# Patient Record
Sex: Female | Born: 1955 | Race: White | Hispanic: No | Marital: Married | Smoking: Former smoker
Health system: Southern US, Academic
[De-identification: ages and names within clinical notes are randomized; demographics above are authoritative.]

## PROBLEM LIST (undated history)

## (undated) DIAGNOSIS — F411 Generalized anxiety disorder: Secondary | ICD-10-CM

## (undated) DIAGNOSIS — I499 Cardiac arrhythmia, unspecified: Secondary | ICD-10-CM

## (undated) DIAGNOSIS — K219 Gastro-esophageal reflux disease without esophagitis: Secondary | ICD-10-CM

## (undated) HISTORY — PX: HX ROTATOR CUFF REPAIR: SHX139

## (undated) HISTORY — DX: Gastro-esophageal reflux disease without esophagitis: K21.9

## (undated) HISTORY — PX: HX CARPAL TUNNEL RELEASE: SHX101

---

## 1992-08-19 ENCOUNTER — Other Ambulatory Visit (HOSPITAL_COMMUNITY): Payer: Self-pay

## 2021-09-23 ENCOUNTER — Other Ambulatory Visit (HOSPITAL_COMMUNITY): Payer: Self-pay

## 2021-09-23 DIAGNOSIS — R102 Pelvic and perineal pain: Secondary | ICD-10-CM

## 2021-09-23 DIAGNOSIS — R109 Unspecified abdominal pain: Secondary | ICD-10-CM

## 2021-09-25 ENCOUNTER — Encounter (HOSPITAL_BASED_OUTPATIENT_CLINIC_OR_DEPARTMENT_OTHER): Payer: Self-pay

## 2021-09-25 ENCOUNTER — Emergency Department (EMERGENCY_DEPARTMENT_HOSPITAL): Payer: Medicare PPO

## 2021-09-25 ENCOUNTER — Emergency Department
Admission: EM | Admit: 2021-09-25 | Discharge: 2021-09-25 | Disposition: A | Discharge status: Home / Self Care | Payer: Medicare PPO | Attending: Family | Admitting: Family

## 2021-09-25 ENCOUNTER — Other Ambulatory Visit: Payer: Self-pay

## 2021-09-25 DIAGNOSIS — R300 Dysuria: Secondary | ICD-10-CM

## 2021-09-25 DIAGNOSIS — R109 Unspecified abdominal pain: Secondary | ICD-10-CM | POA: Insufficient documentation

## 2021-09-25 DIAGNOSIS — M4316 Spondylolisthesis, lumbar region: Secondary | ICD-10-CM | POA: Insufficient documentation

## 2021-09-25 DIAGNOSIS — M419 Scoliosis, unspecified: Secondary | ICD-10-CM | POA: Insufficient documentation

## 2021-09-25 DIAGNOSIS — M48061 Spinal stenosis, lumbar region without neurogenic claudication: Secondary | ICD-10-CM | POA: Insufficient documentation

## 2021-09-25 DIAGNOSIS — K7689 Other specified diseases of liver: Secondary | ICD-10-CM | POA: Insufficient documentation

## 2021-09-25 DIAGNOSIS — Z87440 Personal history of urinary (tract) infections: Secondary | ICD-10-CM | POA: Insufficient documentation

## 2021-09-25 DIAGNOSIS — N200 Calculus of kidney: Secondary | ICD-10-CM | POA: Insufficient documentation

## 2021-09-25 DIAGNOSIS — K389 Disease of appendix, unspecified: Secondary | ICD-10-CM | POA: Insufficient documentation

## 2021-09-25 HISTORY — DX: Generalized anxiety disorder: F41.1

## 2021-09-25 HISTORY — DX: Cardiac arrhythmia, unspecified: I49.9

## 2021-09-25 LAB — URINALYSIS, MACRO/MICRO
BILIRUBIN: NEGATIVE mg/dL
BLOOD: NEGATIVE mg/dL
GLUCOSE: NEGATIVE mg/dL
KETONES: NEGATIVE mg/dL
LEUKOCYTES: NEGATIVE WBCs/uL
NITRITE: NEGATIVE
PH: 6 (ref 4.6–8.0)
PROTEIN: NEGATIVE mg/dL
SPECIFIC GRAVITY: <=1.005 (ref 1.003–1.035)
UROBILINOGEN: 0.2 mg/dL (ref 0.2–1.0)

## 2021-09-25 LAB — COMPREHENSIVE METABOLIC PANEL, NON-FASTING
ALBUMIN/GLOBULIN RATIO: 0.9 (ref 0.8–1.4)
ALBUMIN: 3.4 g/dL (ref 3.4–5.0)
ALKALINE PHOSPHATASE: 73 U/L (ref 46–116)
ALT (SGPT): 22 U/L (ref <=78)
ANION GAP: 7 mmol/L — ABNORMAL LOW (ref 10–20)
AST (SGOT): 24 U/L (ref 15–37)
BILIRUBIN TOTAL: 0.3 mg/dL (ref 0.2–1.0)
BUN/CREA RATIO: 8
BUN: 6 mg/dL — ABNORMAL LOW (ref 7–18)
CALCIUM, CORRECTED: 9.4 mg/dL
CALCIUM: 8.8 mg/dL (ref 8.5–10.1)
CHLORIDE: 103 mmol/L (ref 98–107)
CO2 TOTAL: 27 mmol/L (ref 21–32)
CREATININE: 0.74 mg/dL (ref 0.55–1.02)
ESTIMATED GFR: 90 mL/min/{1.73_m2} (ref >59)
GLOBULIN: 3.6
GLUCOSE: 96 mg/dL (ref 74–106)
OSMOLALITY, CALCULATED: 271 mOsm/kg (ref 270–290)
POTASSIUM: 4 mmol/L (ref 3.5–5.1)
PROTEIN TOTAL: 7 g/dL (ref 6.4–8.2)
SODIUM: 137 mmol/L (ref 136–145)

## 2021-09-25 LAB — CBC WITH DIFF
BASOPHIL #: 0.02 10*3/uL (ref 0.00–2.50)
BASOPHIL %: 0 % (ref 0–3)
EOSINOPHIL #: 0.03 10*3/uL (ref 0.00–2.40)
EOSINOPHIL %: 1 % (ref 0–7)
HCT: 36.2 % — ABNORMAL LOW (ref 37.0–47.0)
HGB: 12.9 g/dL (ref 12.5–16.0)
LYMPHOCYTE #: 2.48 10*3/uL (ref 2.10–11.00)
LYMPHOCYTE %: 40 % (ref 25–45)
MCH: 33.5 pg — ABNORMAL HIGH (ref 27.0–32.0)
MCHC: 35.7 g/dL (ref 32.0–36.0)
MCV: 93.9 fL (ref 78.0–99.0)
MONOCYTE #: 0.34 10*3/uL (ref 0.00–4.10)
MONOCYTE %: 5 % (ref 0–12)
MPV: 7.8 fL (ref 7.4–10.4)
NEUTROPHIL #: 3.33 10*3/uL — ABNORMAL LOW (ref 4.10–29.00)
NEUTROPHIL %: 54 % (ref 40–76)
PLATELETS: 296 10*3/uL (ref 140–440)
RBC: 3.86 10*6/uL — ABNORMAL LOW (ref 4.20–5.40)
RDW: 16.4 % — ABNORMAL HIGH (ref 11.6–14.8)
WBC: 6.2 10*3/uL (ref 4.0–10.5)

## 2021-09-25 MED ORDER — CIPROFLOXACIN 500 MG TABLET
500.0000 mg | ORAL_TABLET | Freq: Two times a day (BID) | ORAL | 0 refills | Status: AC
Start: 2021-09-25 — End: 2021-09-28

## 2021-09-25 MED ORDER — NAPROXEN 500 MG TABLET
500.0000 mg | ORAL_TABLET | Freq: Two times a day (BID) | ORAL | 0 refills | Status: DC
Start: 2021-09-25 — End: 2021-10-19

## 2021-09-25 MED ORDER — KETOROLAC 30 MG/ML (1 ML) INJECTION SOLUTION
INTRAMUSCULAR | Status: AC
Start: 2021-09-25 — End: 2021-09-25
  Filled 2021-09-25: qty 1

## 2021-09-25 MED ORDER — KETOROLAC 30 MG/ML (1 ML) INJECTION SOLUTION
30.0000 mg | INTRAMUSCULAR | Status: AC
Start: 2021-09-25 — End: 2021-09-25
  Administered 2021-09-25: 30 mg via INTRAMUSCULAR

## 2021-09-25 NOTE — ED Provider Notes (Signed)
[facility], [facility] Emergency Department  ED Primary Provider Note  History of Present Illness   Chief Complaint   Patient presents with   . Back Pain     Arrival: The patient arrived by Car    Peggy Pugh is a 66 y.o. female who had concerns including Back Pain.  Flank pain left worse than rt. Hx of recurrent UTI.     Review of Systems   Constitutional: No fever, chills or weakness   Skin: No rash or diaphoresis  HENT: No headaches, or congestion  Eyes: No vision changes or photophobia   Cardio: No chest pain, palpitations or leg swelling   Respiratory: No cough, wheezing or SOB  GI:  No nausea, vomiting , interm constipation, stool changes  GU:  + dysuria,  No hematuria, or increased frequency, flank pain.   MSK: No muscle aches, joint or back pain  Neuro: No seizures, LOC, numbness, tingling, or focal weakness  Psychiatric: No depression, SI or substance abuse  All other systems reviewed and are negative.    Historical Data   History Reviewed This Encounter:  All noted and reviewed.   Physical Exam   ED Triage Vitals [09/25/21 1458]   BP (Non-Invasive) 108/69   Heart Rate 77   Respiratory Rate 18   Temperature 36.8 C (98.3 F)   SpO2 99 %   Weight 60.8 kg (134 lb)   Height 1.524 m (5')       Constitutional:  66 y.o. female who appears in no distress. Normal color, no cyanosis.   HENT:   Head: Normocephalic and atraumatic.   Mouth/Throat: Oropharynx is clear and moist.   Eyes: EOMI, PERRL   Neck: Trachea midline. Neck supple.  Cardiovascular: RRR, No murmurs, rubs or gallops. Intact distal pulses.  Pulmonary/Chest: BS equal bilaterally. No respiratory distress. No wheezes, rales or chest tenderness.   Abdominal: Bowel sounds present and normal. Abdomen soft, no tenderness, no rebound and no guarding.  Back: No midline spinal tenderness, no paraspinal tenderness, + CVA tenderness. Mid left.          Musculoskeletal: No edema, tenderness or deformity.  Skin: warm and dry. No  rash, erythema, pallor or cyanosis  Psychiatric: normal mood and affect. Behavior is normal.   Neurological: Patient keenly alert and responsive, easily able to raise eyebrows, facial muscles/expressions symmetric, speaking in fluent sentences, moving all extremities equally and fully, normal gait  Patient Data     Labs Ordered/Reviewed   COMPREHENSIVE METABOLIC PANEL, NON-FASTING - Abnormal; Notable for the following components:       Result Value    ANION GAP 7 (*)     BUN 6 (*)     All other components within normal limits    Narrative:     Estimated Glomerular Filtration Rate (eGFR) is calculated using the CKD-EPI (2021) equation, intended for patients 72 years of age and older. If gender is not documented or "unknown", there will be no eGFR calculation.   CBC WITH DIFF - Abnormal; Notable for the following components:    RBC 3.86 (*)     HCT 36.2 (*)     MCH 33.5 (*)     RDW 16.4 (*)     NEUTROPHIL # 3.33 (*)     All other components within normal limits   URINALYSIS, MACRO/MICRO - Normal   CBC/DIFF    Narrative:     The following orders were created for panel order CBC/DIFF.  Procedure  Abnormality         Status                     ---------                               -----------         ------                     CBC WITH BJYN[[id number]]                Abnormal            Final result                 Please view results for these tests on the individual orders.   URINALYSIS WITH REFLEX MICROSCOPIC AND CULTURE IF POSITIVE    Narrative:     The following orders were created for panel order URINALYSIS WITH REFLEX MICROSCOPIC AND CULTURE IF POSITIVE.  Procedure                               Abnormality         Status                     ---------                               -----------         ------                     URINALYSIS, MACRO/MICRO[[id number]]      Normal              Final result                 Please view results for these tests on the individual orders.     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 Neither moiety appears enlarged.      Tiny nonobstructing calculus cortical medullary junction the inferior left kidney.      No ureteral calculi or secondary evidence of obstruction is seen on either side. There are some probable phleboliths in the pelvis, some of which are near the ureters but probably not within. There are several nondistended colon segments, probably physiologic contractions.      A possible size prominent appendix is seen, to about 9 mm diameter. No adjacent inflammatory changes are seen. Are there any findings to suggest appendicitis?      This study shows moderately prominent submucosal fat in the cecum and proximal ascending colon. No adjacent inflammatory changes are seen.      Mild levoscoliosis. Minimal anterolisthesis of L3 on L4 and L4 on L5 with mild disc space narrowing.       Tiny cyst in the liver adjacent fissure ligamentum teres 2 small to accurately measure. Density is around 28 year.               One or more dose reduction techniques were used (e.g., Automated exposure control, adjustment of the mA and/or kV according to patient size, use of iterative reconstruction technique).  Radiologist location ID: [place] , UTI pyelonephritis.     Pt educated about all ct findings. Including enlarged appendix, she is non tender i reexamined RLQ and umbilical area. Most of pain is left flank.     Medications Administered in the ED   ketorolac (TORADOL) 30 mg/mL injection (30 mg IntraMUSCULAR Given 09/25/21 1633)     Clinical Impression   Flank pain (Primary)   Dysuria   Disorder of appendix   Nephrolithiasis   Dysuria       Disposition: Discharged

## 2021-09-25 NOTE — ED Nurses Note (Signed)
Lying in bed  visitor at bedside awaiting results of CT  current pain 4/10 after medication

## 2021-09-25 NOTE — ED Triage Notes (Signed)
Lower back pain  to groin area x 1 week. Recent UTI. Finished antibiotics

## 2021-09-26 ENCOUNTER — Other Ambulatory Visit: Payer: Self-pay

## 2021-09-26 DIAGNOSIS — M545 Low back pain, unspecified: Secondary | ICD-10-CM

## 2021-09-26 DIAGNOSIS — R109 Unspecified abdominal pain: Secondary | ICD-10-CM

## 2021-09-26 DIAGNOSIS — N200 Calculus of kidney: Secondary | ICD-10-CM

## 2021-09-26 NOTE — ED Triage Notes (Signed)
Was seen at [facility] yesterday for lower back and pelvic pain.  Found a small kidney stone in her left kidney and an enlarged appendix.  Today she has been nauseated. Has taken Zofran today.

## 2021-09-27 ENCOUNTER — Emergency Department
Admission: EM | Admit: 2021-09-27 | Discharge: 2021-09-27 | Disposition: A | Discharge status: Home / Self Care | Payer: Medicare PPO | Attending: PHYSICIAN ASSISTANT | Admitting: PHYSICIAN ASSISTANT

## 2021-09-27 ENCOUNTER — Encounter (HOSPITAL_COMMUNITY): Payer: Self-pay

## 2021-09-27 DIAGNOSIS — R109 Unspecified abdominal pain: Secondary | ICD-10-CM | POA: Insufficient documentation

## 2021-09-27 DIAGNOSIS — R110 Nausea: Secondary | ICD-10-CM | POA: Insufficient documentation

## 2021-09-27 DIAGNOSIS — M545 Low back pain, unspecified: Secondary | ICD-10-CM | POA: Insufficient documentation

## 2021-09-27 LAB — COMPREHENSIVE METABOLIC PANEL, NON-FASTING
ALBUMIN/GLOBULIN RATIO: 1.3 (ref 0.8–1.4)
ALBUMIN: 3.9 g/dL (ref 3.5–5.7)
ALKALINE PHOSPHATASE: 63 U/L (ref 34–104)
ALT (SGPT): 35 U/L (ref 7–52)
ANION GAP: 7 mmol/L — ABNORMAL LOW (ref 10–20)
AST (SGOT): 54 U/L — ABNORMAL HIGH (ref 13–39)
BILIRUBIN TOTAL: 0.4 mg/dL (ref 0.3–1.2)
BUN/CREA RATIO: 11 (ref 6–22)
BUN: 8 mg/dL (ref 7–25)
CALCIUM, CORRECTED: 9.2 mg/dL (ref 8.9–10.8)
CALCIUM: 9.1 mg/dL (ref 8.6–10.3)
CHLORIDE: 104 mmol/L (ref 98–107)
CO2 TOTAL: 25 mmol/L (ref 21–31)
CREATININE: 0.7 mg/dL (ref 0.60–1.30)
ESTIMATED GFR: 96 mL/min/{1.73_m2} (ref >59)
GLOBULIN: 3.1 (ref 2.9–5.4)
GLUCOSE: 83 mg/dL (ref 74–109)
OSMOLALITY, CALCULATED: 269 mOsm/kg — ABNORMAL LOW (ref 270–290)
POTASSIUM: 4 mmol/L (ref 3.5–5.1)
PROTEIN TOTAL: 7 g/dL (ref 6.4–8.9)
SODIUM: 136 mmol/L (ref 136–145)

## 2021-09-27 LAB — CBC WITH DIFF
BASOPHIL #: 0.1 10*3/uL (ref 0.00–2.50)
BASOPHIL %: 1 % (ref 0–3)
EOSINOPHIL #: 0 10*3/uL (ref 0.00–2.40)
EOSINOPHIL %: 0 % (ref 0–7)
HCT: 38.8 % (ref 37.0–47.0)
HGB: 13.4 g/dL (ref 12.5–16.0)
LYMPHOCYTE #: 3 10*3/uL (ref 2.10–11.00)
LYMPHOCYTE %: 46 % — ABNORMAL HIGH (ref 25–45)
MCH: 32.2 pg — ABNORMAL HIGH (ref 27.0–32.0)
MCHC: 34.5 g/dL (ref 32.0–36.0)
MCV: 93.4 fL (ref 78.0–99.0)
MONOCYTE #: 0.4 10*3/uL (ref 0.00–4.10)
MONOCYTE %: 6 % (ref 0–12)
MPV: 8.3 fL (ref 7.4–10.4)
NEUTROPHIL #: 3 10*3/uL — ABNORMAL LOW (ref 4.10–29.00)
NEUTROPHIL %: 46 % (ref 40–76)
PLATELETS: 293 10*3/uL (ref 140–440)
RBC: 4.15 10*6/uL — ABNORMAL LOW (ref 4.20–5.40)
RDW: 14.4 % (ref 11.6–14.8)
WBC: 6.5 10*3/uL (ref 4.0–10.5)
WBCS UNCORRECTED: 6.5 10*3/uL

## 2021-09-27 LAB — C-REACTIVE PROTEIN(CRP),INFLAMMATION: C-REACTIVE PROTEIN (CRP): 0.3 mg/dL (ref 0.1–0.5)

## 2021-09-27 LAB — LACTIC ACID LEVEL W/ REFLEX FOR LEVEL >2.0: LACTIC ACID: 0.8 mmol/L (ref 0.5–2.2)

## 2021-09-27 MED ORDER — METHOCARBAMOL 500 MG TABLET
500.0000 mg | ORAL_TABLET | Freq: Four times a day (QID) | ORAL | 0 refills | Status: AC
Start: 2021-09-27 — End: 2021-10-02

## 2021-09-27 MED ORDER — ONDANSETRON 4 MG DISINTEGRATING TABLET
4.0000 mg | ORAL_TABLET | Freq: Three times a day (TID) | ORAL | 0 refills | Status: DC | PRN
Start: 2021-09-27 — End: 2022-03-03

## 2021-09-27 NOTE — Discharge Instructions (Signed)
Drink plenty of fluids. Continue any at home medications as previously prescribed. Take medications that are prescribed from today's visit as prescribed. Discuss any questions you may have concerning your medications with your pharmacist. Follow up with your regular PCP in the next 2-3 days. Return to the ED if symptoms worsen, change, or do not improve.

## 2021-09-27 NOTE — ED Provider Notes (Signed)
[facility]  ED Primary Provider Note  History of Present Illness   Chief Complaint   Patient presents with   . Abnormal Lab Result     Peggy Pugh is a 66 y.o. female who had concerns including Abnormal Lab Result.  Arrival: The patient arrived by Car    66 year old female presents ambulatory to the ED complaining of abdominal pain and nausea.  Patient reports that she was at [facility] yesterday for similar complaint.  She had a full workup to include a CT scan there yesterday.  CT scan did show a borderline enlarged appendix at 9 mm no adjacent inflammatory changes.  She is back in our ER today because she started having some increased nausea after taking naproxen.  She reports pain in the lower back that radiates around into the groin.  Denies any fever or chills.  Denies any actual vomiting.  Denies any current urinary symptoms.        Review of Systems   Pertinent positive and negative ROS as per HPI.  Historical Data   History Reviewed This Encounter: Medical History  Surgical History  Family History  Social History      Physical Exam   ED Triage Vitals [09/26/21 2305]   BP (Non-Invasive) 112/68   Heart Rate 84   Respiratory Rate 18   Temperature 36.3 C (97.4 F)   SpO2 100 %   Weight 60.8 kg (134 lb)   Height 1.524 m (5')     Physical Exam  Vitals reviewed.   Constitutional:       General: She is not in acute distress.     Appearance: Normal appearance. She is well-developed.   HENT:      Head: Normocephalic and atraumatic.   Eyes:      Conjunctiva/sclera: Conjunctivae normal.   Cardiovascular:      Rate and Rhythm: Normal rate and regular rhythm.      Heart sounds: No murmur heard.  Pulmonary:      Effort: Pulmonary effort is normal. No respiratory distress.      Breath sounds: Normal breath sounds.   Abdominal:      General: Bowel sounds are normal.      Palpations: Abdomen is soft.      Tenderness: There is no abdominal tenderness. There is no guarding or  rebound.   Musculoskeletal:         General: No swelling.      Cervical back: Neck supple.   Skin:     General: Skin is warm and dry.      Capillary Refill: Capillary refill takes less than 2 seconds.   Neurological:      Mental Status: She is alert.   Psychiatric:         Mood and Affect: Mood normal.       Patient Data     Labs Ordered/Reviewed   COMPREHENSIVE METABOLIC PANEL, NON-FASTING - Abnormal; Notable for the following components:       Result Value    ANION GAP 7 (*)     AST (SGOT) 54 (*)     OSMOLALITY, CALCULATED 269 (*)     All other components within normal limits    Narrative:     Estimated Glomerular Filtration Rate (eGFR) is calculated using the CKD-EPI (2021) equation, intended for patients 26 years of age and older. If gender is not documented or "unknown", there will be no eGFR calculation.   CBC  WITH DIFF - Abnormal; Notable for the following components:    RBC 4.15 (*)     MCH 32.2 (*)     LYMPHOCYTE % 46 (*)     NEUTROPHIL # 3.00 (*)     All other components within normal limits   C-REACTIVE PROTEIN(CRP),INFLAMMATION - Normal   LACTIC ACID LEVEL W/ REFLEX FOR LEVEL >2.0 - Normal   CBC/DIFF    Narrative:     The following orders were created for panel order CBC/DIFF.  Procedure                               Abnormality         Status                     ---------                               -----------         ------                     CBC WITH DIFF[[id number]]                Abnormal            Final result                 Please view results for these tests on the individual orders.     No orders to display     Medical Decision Making        Medical Decision Making  Patient did well while in the ED. No new complaints were voiced. Patient had repeat lab work ordered today since she just had a CT yesterday. Lab work today is within normal limits notably her WBC. She is not tender in the RLQ on exam. She is not having pain after taking naproxen. Her main complaint today was nausea which has  also improved. Given her labs, CT from yesterday and her examination today I do not feel that this is an acute appendicitis. Patient is reporting radiating from her back which could be more musculoskeletal in origin. I will write her robaxin PRN for this.     Amount and/or Complexity of Data Reviewed  Labs: ordered.                  Clinical Impression   Low back pain (Primary)   Abdominal pain, unspecified abdominal location       Disposition: Discharged

## 2021-09-27 NOTE — ED Nurses Note (Signed)
Patient discharged home with family.  AVS reviewed with patient/care giver.  A written copy of the AVS and discharge instructions was given to the patient/care giver.  Questions sufficiently answered as needed.  Patient/care giver encouraged to follow up with PCP as indicated.  In the event of an emergency, patient/care giver instructed to call 911 or go to the nearest emergency room.

## 2021-10-16 ENCOUNTER — Encounter (HOSPITAL_PSYCHIATRIC): Payer: Self-pay | Admitting: Family

## 2021-10-19 ENCOUNTER — Other Ambulatory Visit: Payer: Self-pay

## 2021-10-19 ENCOUNTER — Encounter (HOSPITAL_PSYCHIATRIC): Payer: Self-pay | Admitting: Family

## 2021-10-19 ENCOUNTER — Ambulatory Visit: Payer: Medicare PPO | Attending: Family | Admitting: Family

## 2021-10-19 VITALS — BP 106/68 | HR 99 | Resp 16 | Ht 60.0 in | Wt 134.0 lb

## 2021-10-19 DIAGNOSIS — F410 Panic disorder [episodic paroxysmal anxiety] without agoraphobia: Secondary | ICD-10-CM

## 2021-10-19 DIAGNOSIS — Z79899 Other long term (current) drug therapy: Secondary | ICD-10-CM

## 2021-10-19 DIAGNOSIS — F411 Generalized anxiety disorder: Secondary | ICD-10-CM

## 2021-10-19 HISTORY — DX: Panic disorder (episodic paroxysmal anxiety): F41.0

## 2021-10-19 MED ORDER — ALPRAZOLAM 1 MG TABLET
1.0000 mg | ORAL_TABLET | Freq: Three times a day (TID) | ORAL | 1 refills | Status: DC | PRN
Start: 2021-10-19 — End: 2021-12-02

## 2021-10-19 NOTE — Progress Notes (Signed)
Reason for office visit  Evaluate and treat for mood and depression.   HPI:   In February started on Linzess for chronic constipation then started having panic attacks. Prior to that was taking citrucel, had increased it, started having panic attacks again.   Tried trazodone for sleep, initially slept with meds. Shortly after starting med, had problems with pain and changed from Trazodone back to amitriptyline. Now taking 50 mg and is sleeping, "i have been on it for 29 years."   Had UTI 08/31/2021 and took 3 antibiotics.   I have tried numerous antidepressants.   Has tried Zoloft, luvox, PAXIL, Pamelor,   Anxiety worse with UTI. Now I stay so shaky like meds not working as they should. Has been on this combination for 29 years.   Loss of two brother 2020 in 8 months time.   Has good support system: [place], family.   PHQ9:5  Ph[facility]: [facility].   Past psych history:  Was at [facility], coming home felt hot, could not get out of the car was terrible, eventually diagnosed with panic attacks. Placed on ativan did not help as well. History of anxiety, panic attacks. Denies being hospitalized.     Past medical history: Chronic constipation. Denies concussion.   Past Medical History:   Diagnosis Date   . Acid reflux    . Generalized anxiety disorder    . Irregular heart rate           Family Psychiatric History:   Mother had anxiety. Tension headaches. Denies family history of SUD. Denies family history of schizophrenia, bipolar, or suicide.     Family Medical History:  Family Medical History:     Problem Relation (Age of Onset)    Anxiety Mother    Diabetes Brother    Hypertension (High Blood Pressure) Mother         Brother NASH.     Social History: Peggy Pugh was born in [place]. Lives in this area or [place]. 10th grade, GED, cosmatology license. Had son age 4 and he has spina bifida. Has been his primary caregiver and needs to continue to take care of him. "I am all he has." Denies history ETOH use,  denies smoking but has in the past, Denies THC use or other drug use.   Social History     Socioeconomic History   . Marital status: Married   Tobacco Use   . Smoking status: Former     Types: Cigarettes   . Smokeless tobacco: Never   Vaping Use   . Vaping Use: Never used   Substance and Sexual Activity   . Alcohol use: Never   . Drug use: Never        MSE:  The patient is alert and oriented x4, casually dressed, good eye contact, well groomed, appearing stated age.  Speech is normal rate and tone.  Patient is talkative and personable. There is no flight of ideas, loosening of associations, or tangential speech.  Not manic.  Mood is euthymic with no complaints.  Affect congruent.  Patient does not appear to be in any acute physical distress.  No suicidal or homicidal ideation.  No auditory or visual hallucinations, no delusions, no paranoia.  No signs of psychosis.  No plans to harm self or others.  Patient is not aggressive or threatening.  No psychomotor agitation.  No psychomotor retardation.  No abnormal involuntary movements. Thoughts are linear, logical, and goal directed.  Intellectual functioning is good.  Memory is intact to recent, remote, and past events.  Patient can recall 3 of 3 objects at 0 and 5 minutes, and what was eaten for last meal.  Patient is able to provide details of current situation.  Patient can name the president, vice president, and governor.  Language is good.  Vocabulary is unimpaired, no word finding difficulty or word misuse.  Intelligence is good, patient can interpret a proverb, and reports apple and orange similarity.  Calculation is unimpaired.  Concentration is good, able to recite days of week forward and backward.  Insight is good; patient is aware of their illness, how it affects their functioning, and what needs to happen for future improvement.  Judgment is good; patient is compliant with treatment and can relate appropriately to what they would do if smelling smoke in her  home.   Diagnosis:  Axis 1: GAD with panic attacks, exacerbation   Axis 2:  Allergic to Bactrim, atorvastatin, clindamycin, methocarbomol.   Axis 3: Irregular heart beat, constipation.   Axis 4: Severe psychosocial stressors  Axis 5: Current GAF: 75  , best in past year: unknown.       Plan: Moderate level of medical decision making, discussion of multiple diagnosis and symptoms, review of PHQ-9, MDQ7 review of symptoms, patient education, discussion of prescribed medications and potential side effects, discussion of psychosocial stressors, and review of prescription monitoring program information.   Discussed xanax and if stops meds abruptly could cause seizure. Patient verbalizes understanding.   Consider Inderal 10 mg Twice a day, will communicate with PCP.   [name], PMHNP-BC  10/19/2021, 11:34

## 2021-10-19 NOTE — Addendum Note (Signed)
Addended by: Rickard Rhymes B on: 10/19/2021 12:11 PM     Modules accepted: Orders

## 2021-10-20 NOTE — Addendum Note (Signed)
Addended by: Patton Salles on: 10/20/2021 01:56 PM     Modules accepted: Orders

## 2021-10-24 LAB — DRUG MONITORING, PANEL 5, W/CONFIRM, D/L ISOMERS,URINE
Alphahydroxyalprazolam: 330 ng/mL — ABNORMAL HIGH (ref <25)
Alphahydroxymidazolam: NEGATIVE ng/mL (ref <50)
Alphahydroxytriazolam: NEGATIVE ng/mL (ref <50)
Aminoclonazepam: NEGATIVE ng/mL (ref <25)
Amphetamines: NEGATIVE ng/mL (ref <500)
Barbiturates: NEGATIVE ng/mL (ref <300)
Benzodiazepines: POSITIVE ng/mL — AB (ref <100)
Cocaine Metabolite: NEGATIVE ng/mL (ref <100)
Creatinine: 81.7 mg/dL (ref >=20.0)
Hydroxyethylflurazepam: NEGATIVE ng/mL (ref <50)
Lorazepam: NEGATIVE ng/mL (ref <50)
Marijuana Metabolite: NEGATIVE ng/mL (ref <20)
Methadone Metabolite: NEGATIVE ng/mL (ref <100)
Nordiazepam: NEGATIVE ng/mL (ref <50)
Opiates: NEGATIVE ng/mL (ref <100)
Oxazepam: NEGATIVE ng/mL (ref <50)
Oxidant: NEGATIVE ug/mL (ref <200)
Oxycodone: NEGATIVE ng/mL (ref <100)
Temazepam: NEGATIVE ng/mL (ref <50)
pH: 6.1 (ref 4.5–9.0)

## 2021-10-24 LAB — NOTES AND COMMENTS

## 2021-10-27 ENCOUNTER — Encounter (INDEPENDENT_AMBULATORY_CARE_PROVIDER_SITE_OTHER): Payer: Self-pay | Admitting: Surgery

## 2021-10-27 ENCOUNTER — Other Ambulatory Visit: Payer: Self-pay

## 2021-10-27 ENCOUNTER — Ambulatory Visit (INDEPENDENT_AMBULATORY_CARE_PROVIDER_SITE_OTHER): Payer: Medicare PPO | Admitting: Surgery

## 2021-10-27 VITALS — BP 102/76 | HR 87 | Temp 97.5°F | Ht 60.0 in | Wt 133.0 lb

## 2021-10-27 DIAGNOSIS — R933 Abnormal findings on diagnostic imaging of other parts of digestive tract: Secondary | ICD-10-CM

## 2021-10-29 NOTE — H&P (Signed)
GENERAL SURGERY, [facility]  [address]  [place] [address]    History and Physical     Name: Peggy Pugh MRN:  [id number]   Date: 10/27/2021 Age: 66 y.o.            Reason for Visit: Appendix Adult (Enlarged appendix )    History of Present Illness  [name] presents today to be evaluated because of an enlarged appendix seen on CT scan.  The patient was actually being evaluated because of urinary tract infections and back pain radiating to the lower abdomen.  She was found to have kidney stones.  She was also noted to have a possible prominent appendix to about 9 mm in diameter although she has no symptoms whatsoever that would be suspicious for appendicitis.  The patient states that she has an appointment to see a GI specialist in [place] and therefore I would recommend that she continues that evaluation with a colonoscopy and then proceed from there.      Review of the result(s) of each unique test:  Patient underwent diagnostic testing ( CT scan as mentioned above) prior to this dates visit.  I have personally reviewed the results and that serves as a component of the medical decision making for this encounter       Review of prior external note(s) from each unique source:  Patients referral to this office including a recent assessment by the referring provider.  This was reviewed by me for this unique office visit for the indication and intent of the referral as well as any pertinent medical or surgical history relevant to the patients independent evaluation by me today.      Patient History  Past Medical History:   Diagnosis Date   . Acid reflux    . Generalized anxiety disorder    . Irregular heart rate    . Panic attacks 10/19/2021         Past Surgical History:   Procedure Laterality Date   . HX CARPAL TUNNEL RELEASE     . HX ROTATOR CUFF REPAIR           Current Outpatient Medications   Medication Sig   . ALPRAZolam (XANAX) 1 mg Oral Tablet Take 1  Tablet (1 mg total) by mouth Every 8 hours as needed for Insomnia or Anxiety for up to 30 days May take 1 tablet up to 3 times a day.   Marland Kitchen amitriptyline (ELAVIL) 50 mg Oral Tablet Take 1 Tablet (50 mg total) by mouth Every night   . atenoloL (TENORMIN) 50 mg Oral Tablet Take 1 Tablet (50 mg total) by mouth Once a day   . esomeprazole magnesium (NEXIUM) 40 mg Oral Capsule, Delayed Release(E.C.) Take 1 Capsule (40 mg total) by mouth Once a day   . meloxicam (MOBIC) 15 mg Oral Tablet 1 tab(s) orally PRN   . ondansetron (ZOFRAN ODT) 4 mg Oral Tablet, Rapid Dissolve Take 1 Tablet (4 mg total) by mouth Every 8 hours as needed for Nausea/Vomiting     Allergies   Allergen Reactions   . 5-Hydroxytryptophan      Other reaction(s): Unknown   . Atorvastatin  Other Adverse Reaction (Add comment)     Leg cramping   . Clindamycin  Other Adverse Reaction (Add comment)     uncertain   . Robaxin [Methocarbamol]  Other Adverse Reaction (Add comment)     Head felt weird   . Bactrim [Sulfamethoxazole-Trimethoprim]  Hives/ Urticaria   . Tramadol Mental Status Effect     Family Medical History:     Problem Relation (Age of Onset)    Anxiety Mother    Diabetes Brother    Hypertension (High Blood Pressure) Mother          Social History     Tobacco Use   . Smoking status: Former     Types: Cigarettes   . Smokeless tobacco: Never   Vaping Use   . Vaping Use: Never used   Substance Use Topics   . Alcohol use: Never   . Drug use: Never            Physical Examination:  Vitals:    10/27/21 1555   BP: 102/76   Pulse: 87   Temp: 36.4 C (97.5 F)   Weight: 60.3 kg (133 lb)   Height: 1.524 m (5')   BMI: 26.03        General: appropriate for age. in no acute distress.    Vital signs are present above and have been reviewed by me     HEENT: Atraumatic, Normocephalic. PERRLA. EOMI. Nose clear. Throat clear    Lungs: Nonlabored breathing with symmetric expansion. Clear to auscultation bilaterally    Heart:Regular wth respect to rate and  rythmn.    Abdomen:Soft. Nontender. Nondistended and benign    Extremities: Grossly normal. No major deformities     Neuro:  Grossly normal motor and sensory function    Psychiatric: Alert and oriented to person, place, and time. affect appropriate      Assessment and Plan  Possible enlarged appendix up to 9 mm in diameter with no symptoms     The patient will be seeing a GI specialist in [place].  I recommended that a colonoscopy be performed and then based on those findings then further evaluation and management may be indicated.  At this time because the patient is asymptomatic and this possible distended appendix was seen incidentally on CT scan I would not recommend urgent surgical intervention.  The patient was instructed to notify me of the colonoscopy findings or at least what the GI doctor recommends.      Follow Up:  No follow-ups on file.      ICD-10-CM    1. Abnormal finding on GI tract imaging  R93.3           Florence Yeung B Nolberto Cheuvront, MD ,MBA,FACS    I appreciate the opportunity to be involved in the care of your patients.  If you have any questions or concerns regarding this encounter, please do not hesitate to contact me at your convenience.      This note may have been partially generated using MModal Fluency Direct system, and there may be some incorrect words, spellings, and punctuation that were not noted in checking the note before saving, though effort was made to avoid such errors.

## 2021-12-02 ENCOUNTER — Encounter (HOSPITAL_PSYCHIATRIC): Payer: Self-pay | Admitting: Family

## 2021-12-02 ENCOUNTER — Ambulatory Visit: Payer: Medicare PPO | Attending: Family | Admitting: Family

## 2021-12-02 ENCOUNTER — Other Ambulatory Visit: Payer: Self-pay

## 2021-12-02 VITALS — BP 112/68 | HR 70 | Resp 16 | Ht 60.0 in | Wt 132.0 lb

## 2021-12-02 DIAGNOSIS — F330 Major depressive disorder, recurrent, mild: Secondary | ICD-10-CM | POA: Insufficient documentation

## 2021-12-02 DIAGNOSIS — F411 Generalized anxiety disorder: Secondary | ICD-10-CM | POA: Insufficient documentation

## 2021-12-02 MED ORDER — ALPRAZOLAM 1 MG TABLET
1.0000 mg | ORAL_TABLET | Freq: Three times a day (TID) | ORAL | 2 refills | Status: DC | PRN
Start: 2021-12-02 — End: 2021-12-21

## 2021-12-02 NOTE — Progress Notes (Signed)
[facility]  BEHAVIORAL MEDICINE, [facility] [place]  Operated by [facility]  Progress Note    Name: Peggy Pugh MRN:  [id number]   Date: 12/02/2021 Age: 66 y.o.       Chief Complaint: Panic Disorder and Generalized Anxiety; Medication review.     Subjective:   I am feeling better. Has been to the beach for a while and that helped. Felt paralyzed but now able to do things she needs in order to care for son and household. Ativan some days need 1/2 tab of 3rd dose and sometimes does not need dose. "I feel like I am Verl again." I don't feel jittery inside.    Objective :  BP 112/68 (Site: Right, Patient Position: Sitting, Cuff Size: Adult)   Pulse 70   Resp 16   Ht 1.524 m (5')   Wt 59.9 kg (132 lb)   SpO2 96%   BMI 25.78 kg/m   Pleasant, calm, cordial. Talkative and sociable.   PDMP reviewed and appropriate.   PHQ9: 4    Data reviewed:  Previous HPI:   In February started on Linzess for chronic constipation then started having panic attacks. Prior to that was taking citrucel, had increased it, started having panic attacks again.   Tried trazodone for sleep, initially slept with meds. Shortly after starting med, had problems with pain and changed from Trazodone back to amitriptyline. Now taking 50 mg and is sleeping, "i have been on it for 29 years."   Had UTI 08/31/2021 and took 3 antibiotics.   I have tried numerous antidepressants.   Has tried Zoloft, luvox, PAXIL, Pamelor,   Anxiety worse with UTI. Now I stay so shaky like meds not working as they should. Has been on this combination for 29 years.   Loss of two brother 2020 in 8 months time.   Has good support system: Church, family.   Pharmacy: [facility] or [place]. [phone].   Past psych history:  Was at [facility], coming home felt hot, could not get out of the car was terrible, eventually diagnosed with panic attacks. Placed on ativan did not help as well. History of anxiety, panic  attacks. Denies being hospitalized.     Current Outpatient Medications   Medication Sig   . amitriptyline (ELAVIL) 50 mg Oral Tablet Take 1 Tablet (50 mg total) by mouth Every night   . atenoloL (TENORMIN) 50 mg Oral Tablet Take 1 Tablet (50 mg total) by mouth Once a day   . esomeprazole magnesium (NEXIUM) 40 mg Oral Capsule, Delayed Release(E.C.) Take 1 Capsule (40 mg total) by mouth Once a day   . meloxicam (MOBIC) 15 mg Oral Tablet 1 tab(s) orally PRN   . ondansetron (ZOFRAN ODT) 4 mg Oral Tablet, Rapid Dissolve Take 1 Tablet (4 mg total) by mouth Every 8 hours as needed for Nausea/Vomiting     Assessment/Plan  Problem List Items Addressed This Visit    None  Major depressive disorder, recurrent, mild with GAD.    continue current medications, xanax 1 mg up to TID, amitriptyline 50 mg at bedtime.   follow-up 3 months.     Candie Mile, PMHNP-BC

## 2021-12-21 ENCOUNTER — Other Ambulatory Visit (HOSPITAL_PSYCHIATRIC): Payer: Self-pay | Admitting: Family

## 2021-12-21 MED ORDER — ALPRAZOLAM 1 MG TABLET
1.0000 mg | ORAL_TABLET | Freq: Three times a day (TID) | ORAL | 2 refills | Status: AC | PRN
Start: 2021-12-21 — End: 2022-01-20

## 2021-12-21 NOTE — Telephone Encounter (Signed)
RX approved and encounter closed  Candie Mile, PMHNP-BC  12/21/2021 12:50

## 2021-12-21 NOTE — Telephone Encounter (Signed)
I cancelled patient's prescription that was on hold at [facility] in [place], because patient wants it sent to [facility] in [place]. I have got the prescription ready for refill and added the pharmacy in [place] if you don't mind to refill it there. Thanks!

## 2022-03-03 ENCOUNTER — Encounter (HOSPITAL_PSYCHIATRIC): Payer: Self-pay | Admitting: Family

## 2022-03-03 ENCOUNTER — Other Ambulatory Visit: Payer: Self-pay

## 2022-03-03 ENCOUNTER — Ambulatory Visit: Payer: Medicare PPO | Attending: Family | Admitting: Family

## 2022-03-03 VITALS — BP 96/68 | HR 61 | Resp 18 | Ht 65.0 in | Wt 138.0 lb

## 2022-03-03 DIAGNOSIS — F411 Generalized anxiety disorder: Secondary | ICD-10-CM | POA: Insufficient documentation

## 2022-03-03 DIAGNOSIS — F410 Panic disorder [episodic paroxysmal anxiety] without agoraphobia: Secondary | ICD-10-CM | POA: Insufficient documentation

## 2022-03-03 DIAGNOSIS — F330 Major depressive disorder, recurrent, mild: Secondary | ICD-10-CM | POA: Insufficient documentation

## 2022-03-03 MED ORDER — ALPRAZOLAM 1 MG TABLET
1.0000 mg | ORAL_TABLET | Freq: Three times a day (TID) | ORAL | 2 refills | Status: AC | PRN
Start: 2022-03-03 — End: 2022-04-02
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
## 2022-03-03 NOTE — Patient Instructions (Signed)
Continue xanax 1 mg TID prn anxiety. Elavil 50 mg at bedtime for sleep and mood.     If thoughts of self harm or worsening depression please let us know.

## 2022-04-30 ENCOUNTER — Telehealth (HOSPITAL_PSYCHIATRIC): Payer: Self-pay | Admitting: Family

## 2022-04-30 NOTE — Telephone Encounter (Signed)
Patient called and said 6 months ago that you had changed the dosage of Xanax. Tuesday got real sick on her stomach and had a headache. That night she did not sleep. Wednesday and Thursday night she has not slept. Has been taking 2 of the Xanax a day. Yesterday she took three and it still did not help.      [phone]

## 2022-04-30 NOTE — Telephone Encounter (Signed)
Call to patient. "I have been doing fantastic." Has been at [facility] .Taking her xanax. Has not needed 3 a day. Had been sleeping well and doing well. Reports this past Tuesday evening was nausea with headache, top of head. Took tylenol and Zofran, helped some.  Slept 2 hours and was awake.   Wednesday night: did not sleep at all, took 1/2 xanax 0200.   Took covid test negative  When woke up this morning my head was hurting  Denies being upset etc. This is not a new change for [place].   Relates she was around someone Sunday with Covid.   Speech is a little slurred.

## 2022-05-03 ENCOUNTER — Encounter (INDEPENDENT_AMBULATORY_CARE_PROVIDER_SITE_OTHER): Payer: Self-pay | Admitting: Surgery

## 2022-06-24 ENCOUNTER — Encounter (HOSPITAL_PSYCHIATRIC): Payer: Self-pay | Admitting: Family

## 2022-06-24 ENCOUNTER — Other Ambulatory Visit: Payer: Self-pay

## 2022-06-24 ENCOUNTER — Ambulatory Visit: Payer: Medicare PPO | Attending: Family | Admitting: Family

## 2022-06-24 VITALS — BP 116/75 | HR 92 | Resp 18 | Ht 60.0 in | Wt 138.0 lb

## 2022-06-24 DIAGNOSIS — F411 Generalized anxiety disorder: Secondary | ICD-10-CM | POA: Insufficient documentation

## 2022-06-24 DIAGNOSIS — Z79899 Other long term (current) drug therapy: Secondary | ICD-10-CM | POA: Insufficient documentation

## 2022-06-24 DIAGNOSIS — F410 Panic disorder [episodic paroxysmal anxiety] without agoraphobia: Secondary | ICD-10-CM

## 2022-06-24 MED ORDER — ALPRAZOLAM 1 MG TABLET
1.0000 mg | ORAL_TABLET | Freq: Three times a day (TID) | ORAL | 2 refills | Status: DC | PRN
Start: 2022-06-24 — End: 2023-05-10

## 2022-06-24 MED ORDER — AMITRIPTYLINE 50 MG TABLET
50.0000 mg | ORAL_TABLET | Freq: Every evening | ORAL | 1 refills | Status: DC
Start: 2022-06-24 — End: 2022-12-27

## 2022-06-24 NOTE — Patient Instructions (Signed)
Discussed with Peggy Pugh risk of long term use of xanax. Relates she has taken for a long time. She tried years ago to come off of med and had profound anxiety. She is aware of risk of falling and that she can not stop med abruptly. She knows to keep medication safe.     Continue Xanax 1 mg up to 3 times a day for anxiety.  Continue Elavil 50 mg at bedtime for sleep and mood.   Peggy Pugh is aware to contact us if there are concerns or problems  Follow up end of February early march prior to returning to the beach.

## 2022-06-24 NOTE — Progress Notes (Signed)
[place] [facility]ICINE, [facility] [place]  Operated by [facility]  Progress Note    Name: Peggy Pugh MRN:  [id number]   Date: 06/24/2022 Age: 66 y.o.       Chief Complaint: Panic Disorder and Generalized Anxiety    Subjective:   Peggy Pugh is here for follow up regarding anxiety. Relates this morning she has some sore throat.   Reports sleeping well.   Denies SI/HI/AVH. Feels her mood is good overall.   Taking xanax twice a day every day and 3 a day if needed. Taking Elavil at bedtime.   Plans to return to the [facility] [place] and spend her birthday there. Plans Church for [employer]. 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     Feels last true panic attack was prior to when she came to see me.     Objective :  BP 116/75 (Site: Right, Patient Position: Sitting)   Pulse 92   Resp 18   Ht 1.524 m (5')   Wt 62.6 kg (138 lb)   BMI 26.95 kg/m     Denies chest pain, Denies recent falls, denies flutters in chest, denies dizziness.    Alert and oriented x4.  Casual dress, calm, well groomed.  No SI /HI/AVH, delusions, or paranoia. Thoughts are logical, coherent, goal directed.  Good eye contact. Speech is normal rate and tone.  Mood is ``ok affect congruent.  No psychomotor agitation or psychomotor retardation, no cogwheel rigidity or abnormal movements.  Gait is normal.  Attention is good.  Concentration and memory good.  No cognitive deficits noted.  Judgment fair, insight fair.  Calculation and abstraction are within normal limits.      PDMP: Appropriate, xanax filled 05/31/2022  PHQ9: 0    Data reviewed:    Current Outpatient Medications   Medication Sig    ALPRAZolam (XANAX) 1 mg Oral Tablet Take 1 Tablet (1 mg total) by mouth Every 8 hours as needed for Anxiety    amitriptyline (ELAVIL) 50 mg Oral Tablet Take 1 Tablet (50 mg total) by mouth Every night    atenoloL (TENORMIN) 50 mg Oral Tablet Take 1 Tablet (50 mg total) by mouth Once a day    esomeprazole  magnesium (NEXIUM) 40 mg Oral Capsule, Delayed Release(E.C.) Take 1 Capsule (40 mg total) by mouth Once a day    fluticasone propionate (FLONASE) 50 mcg/actuation Nasal Spray, Suspension Administer 2 Sprays into each nostril Once per day as needed for Other (Nasal congestion)    Lactobac 40-Bifido 3-S.thermop 100 billion cell Oral Capsule Take 1 Capsule by mouth Once a day    magnesium oxide (MAG-OX) 400 mg Oral Tablet Take 1 Tablet (400 mg total) by mouth Every night     Assessment/Plan  Problem List Items Addressed This Visit          Psychiatric    Panic attacks (Chronic)    GAD (generalized anxiety disorder) - Primary     Alert and oriented x4.  Casual dress, calm, well groomed.  No SI /HI/AVH, delusions, or paranoia. Thoughts are logical, coherent, goal directed.  Good eye contact. Speech is normal rate and tone.  Mood is ``ok affect congruent.  No psychomotor agitation or psychomotor retardation, no cogwheel rigidity or abnormal movements.  Gait is normal.  Attention is good.  Concentration and memory good.  No cognitive deficits noted.  Judgment fair, insight fair.  Calculation and abstraction are within normal limits.  Discussed with Peggy Pugh risk of long term use of xanax. Relates she has taken for a long time. She tried years ago to come off of med and had profound anxiety. She is aware of risk of falling and that she can not stop med abruptly. She knows to keep medication safe.     Continue Xanax 1 mg up to 3 times a day for anxiety.  Continue Elavil 50 mg at bedtime for sleep and mood.   [name] is aware to contact Peggy Pugh if there are concerns or problems  Follow up end of February early march prior to returning to the beach.    Peggy Pugh, PMHNP-BC  06/24/2022 12:27

## 2022-07-02 ENCOUNTER — Ambulatory Visit (HOSPITAL_PSYCHIATRIC): Payer: Self-pay | Admitting: Family

## 2022-09-08 ENCOUNTER — Other Ambulatory Visit: Payer: Self-pay

## 2022-09-08 ENCOUNTER — Ambulatory Visit: Payer: Medicare PPO | Attending: Family | Admitting: Family

## 2022-09-08 ENCOUNTER — Encounter (HOSPITAL_PSYCHIATRIC): Payer: Self-pay | Admitting: Family

## 2022-09-08 VITALS — BP 109/73 | HR 72 | Resp 18 | Ht 65.0 in | Wt 143.0 lb

## 2022-09-08 DIAGNOSIS — F410 Panic disorder [episodic paroxysmal anxiety] without agoraphobia: Secondary | ICD-10-CM | POA: Insufficient documentation

## 2022-09-08 DIAGNOSIS — F330 Major depressive disorder, recurrent, mild: Secondary | ICD-10-CM | POA: Insufficient documentation

## 2022-09-08 DIAGNOSIS — F411 Generalized anxiety disorder: Secondary | ICD-10-CM | POA: Insufficient documentation

## 2022-09-08 NOTE — Progress Notes (Signed)
[place], [facility] [place]  Operated by [facility]  Progress Note    Name: Peggy Pugh MRN:  [id number]   Date: 09/08/2022 Age: 67 y.o.       Chief Complaint: Panic Disorder and Generalized Anxiety    Subjective:   Patient here for follow up. Episode Monday, out of town shopping with friend. That afternoon, did not feel depressed, but was not as into shopping as normally would be. Was on couch, watching a movie but fell asleep. Went to bed, then awoke 1230 and could not go back to sleep. Took 1/2 of a xanax but did not go back to sleep.   Yesterday had a small shaky episode, after took 5 pm xanax, decided to take 1 tab at bedtime for sleep.   Other than a few days has been ok.   But sometimes during winter, a swoop of depression but does not stay.   Taking acidophilus   On average took xanax 2 times a day and then 1/2 to 1 at night if needed for sleep.   Denies thoughts of SI/HI/AVH.    Objective :  BP 109/73 (Site: Left, Patient Position: Sitting)   Pulse 72   Resp 18   Ht 1.651 m (5\' 5" )   Wt 64.9 kg (143 lb)   BMI 23.80 kg/m     Denies chest pain, flutters in chest, syncope.     Alert and oriented x4.  Casual dress, calm, well groomed.  No SI /HI/AVH, delusions, or paranoia. Thoughts are logical, coherent, goal directed.  Good eye contact. Speech is normal rate and tone.  Mood is ``ok affect congruent.  No psychomotor agitation or psychomotor retardation, no cogwheel rigidity or abnormal movements.  Gait is normal.  Attention is good.  Concentration and memory good.  No cognitive deficits noted.  Judgment fair, insight fair.  Calculation and abstraction are within normal limits.      PDMP: Xanax filled every 30 days, patient has a "full " bottle and relates she does not need refill currently.  PHQ9: 1  Data reviewed:    Current Outpatient Medications   Medication Sig    ALPRAZolam (XANAX) 1 mg Oral Tablet Take 1 Tablet (1 mg total)  by mouth Every 8 hours as needed for Anxiety    amitriptyline (ELAVIL) 50 mg Oral Tablet Take 1 Tablet (50 mg total) by mouth Every night for 90 days Indications: anxious, depression    atenoloL (TENORMIN) 50 mg Oral Tablet Take 1 Tablet (50 mg total) by mouth Once a day    esomeprazole magnesium (NEXIUM) 40 mg Oral Capsule, Delayed Release(E.C.) Take 1 Capsule (40 mg total) by mouth Once a day    fluticasone propionate (FLONASE) 50 mcg/actuation Nasal Spray, Suspension Administer 2 Sprays into each nostril Once per day as needed for Other (Nasal congestion)    Lactobac 40-Bifido 3-S.thermop 100 billion cell Oral Capsule Take 1 Capsule by mouth Once a day    magnesium oxide (MAG-OX) 400 mg Oral Tablet Take 1 Tablet (400 mg total) by mouth Every night     Assessment/Plan  Problem List Items Addressed This Visit          Psychiatric    Panic attacks (Chronic)    GAD (generalized anxiety disorder)     Other Visit Diagnoses       Mild episode of recurrent major depressive disorder (CMS HCC)    -  Primary  Moderate level of medical decision making, discussion of multiple diagnosis and symptoms, review of PHQ-9, review of symptoms, patient education, discussion of prescribed medications and potential side effects, discussion of psychosocial stressors, and review of prescription monitoring program information.   Patient is currently doing very well. Reassurance provided.  Continue xanax 1 mg up to 3 times a day for anxiety, advised to keep med safe. Do not drink alcohol and take med. Be cautious regarding falls. Do not stop med abruptly due to risk of seizures.   Continue Elavil 50 mg at bedtime as well for mood.   Follow-up 4 months. Let me know when you need a refill.     [name], PMHNP-BC  09/08/2022 13:55

## 2022-09-08 NOTE — Patient Instructions (Signed)
Continue xanax 1 mg up to 3 times a day for anxiety, advised to keep med safe. Do not drink alcohol and take med. Be cautious regarding falls. Do not stop med abruptly due to risk of seizures.   Continue Elavil 50 mg at bedtime as well for mood.   Follow-up 4 months. Let me know when you need a refill.

## 2022-09-09 ENCOUNTER — Encounter (HOSPITAL_PSYCHIATRIC): Payer: Self-pay | Admitting: Family

## 2022-09-09 NOTE — Telephone Encounter (Signed)
Patient called and states she took her elavil and 1/2 her xanax last night. She states she only slept from 11pm-3am.  Will be happy to call patient back with any advice or orders you may have. Thank You

## 2022-09-13 ENCOUNTER — Telehealth (HOSPITAL_PSYCHIATRIC): Payer: Self-pay | Admitting: Family

## 2022-09-13 NOTE — Telephone Encounter (Signed)
Patient called and states that she is still struggling with insomnia. She states that maybe one night out of the week she may sleep all night, but the rest of the week she is only sleeping 3 hours a night. She states that she is taking her xanax 1 mg in the morning, 8 hours later she takes another 1 mg tablet, and the 1/2 tablet at bedtime. She states that she also takes her Amitriptyline. None of this regimen is helping and she is wondering if you have any recommendations. Just let me know your thoughts and I will give her a call back. Thanks!

## 2022-09-13 NOTE — Telephone Encounter (Signed)
Called patient per [name] with her instructions to take a whole Xanax three times a day. Patient verbalized understanding and states that she has never been told she has sleep apnea or been tested for it. She states that she has tried the relaxation techniques without much success. Patient states that she has an appointment with [name] on Thursday, 03.14.2024 and will discuss further then. Patient appreciated the call back.

## 2022-09-16 ENCOUNTER — Other Ambulatory Visit: Payer: Self-pay

## 2022-09-16 ENCOUNTER — Encounter (HOSPITAL_PSYCHIATRIC): Payer: Self-pay | Admitting: Family

## 2022-09-16 ENCOUNTER — Ambulatory Visit: Payer: Medicare PPO | Attending: Family | Admitting: Family

## 2022-09-16 VITALS — BP 115/69 | HR 81 | Resp 18 | Ht 60.0 in | Wt 138.0 lb

## 2022-09-16 DIAGNOSIS — F410 Panic disorder [episodic paroxysmal anxiety] without agoraphobia: Secondary | ICD-10-CM | POA: Insufficient documentation

## 2022-09-16 DIAGNOSIS — G479 Sleep disorder, unspecified: Secondary | ICD-10-CM | POA: Insufficient documentation

## 2022-09-16 DIAGNOSIS — F330 Major depressive disorder, recurrent, mild: Secondary | ICD-10-CM | POA: Insufficient documentation

## 2022-09-16 MED ORDER — AMITRIPTYLINE 10 MG TABLET
10.0000 mg | ORAL_TABLET | Freq: Every evening | ORAL | 1 refills | Status: DC
Start: 2022-09-16 — End: 2022-09-23

## 2022-09-16 NOTE — Progress Notes (Signed)
BEHAVIORAL MEDICINE, [facility] [place]  [place] [id number]  Operated by [facility]  Progress Note    Name: Peggy Pugh MRN:  [id number]   Date: 09/16/2022 DOB:  22-Jun-1956 (67 y.o.)           Chief Complaint: Generalized Anxiety and Panic Disorder    Subjective:   Patient here once again for concerns regarding sleep.   Reports stress regarding possibly selling home, buying home, moving. The home here is her security, sometimes I am ready and sometimes I am not.     Has now been taking xanax 3 a day and sleeping better.   Trial of increase Elavil to 75 mg nightly, tried one night and did not go well.     Objective :  BP 115/69 (Site: Right, Patient Position: Sitting)   Pulse 81   Resp 18   Ht 1.524 m (5')   Wt 62.6 kg (138 lb)   BMI 26.95 kg/m     Denies chest pain, dizziness, palpitations. Denies feeling shaky.     Alert and oriented x4.  Casual dress, calm, well groomed.  No SI /HI/AVH, delusions, or paranoia. Thoughts are logical, coherent, goal directed.  Good eye contact. Speech is normal rate and tone.  Mood is ``ok affect congruent.  No psychomotor agitation or psychomotor retardation, no cogwheel rigidity or abnormal movements.  Gait is normal.  Attention is good.  Concentration and memory good.  No cognitive deficits noted.  Judgment fair, insight fair.  Calculation and abstraction are within normal limits.      PHQ9: 1  PDMP: Xanax filled 09/02/2022    Data reviewed:    Current Outpatient Medications   Medication Sig    ALPRAZolam (XANAX) 1 mg Oral Tablet Take 1 Tablet (1 mg total) by mouth Every 8 hours as needed for Anxiety    amitriptyline (ELAVIL) 50 mg Oral Tablet Take 1 Tablet (50 mg total) by mouth Every night for 90 days Indications: anxious, depression    atenoloL (TENORMIN) 50 mg Oral Tablet Take 1 Tablet (50 mg total) by mouth Once a day    esomeprazole magnesium (NEXIUM) 40 mg Oral Capsule, Delayed Release(E.C.)  Take 1 Capsule (40 mg total) by mouth Once a day    fluticasone propionate (FLONASE) 50 mcg/actuation Nasal Spray, Suspension Administer 2 Sprays into each nostril Once per day as needed for Other (Nasal congestion)    Lactobac 40-Bifido 3-S.thermop 100 billion cell Oral Capsule Take 1 Capsule by mouth Once a day    magnesium oxide (MAG-OX) 400 mg Oral Tablet Take 1 Tablet (400 mg total) by mouth Every night     Assessment/Plan  Problem List Items Addressed This Visit          Psychiatric    Panic attacks (Chronic)     Other Visit Diagnoses       Mild episode of recurrent major depressive disorder (CMS HCC)    -  Primary    Sleep disturbance              Moderate level of medical decision making, discussion of multiple diagnosis and symptoms, review of PHQ-9, review of symptoms, patient education, discussion of prescribed medications and potential side effects, discussion of psychosocial stressors, and review of prescription monitoring program information.   May take xanax 1 mg up to 3 times a day.  Continue Elavil 50 mg at bedtime and add amitriptyline (Elavil) 10 mg to the  Elavil. If that does not help sleep let me know.   If sleep does not improve consider adding seroquel 12.5 mg at bedtime or Remeron 7.5 mg at bedtime.   [name], PMHNP-BC  09/16/2022 11:05

## 2022-09-16 NOTE — Patient Instructions (Addendum)
May take xanax 1 mg up to 3 times a day.  Continue Elavil 50 mg at bedtime and add amitriptyline (Elavil) 10 mg to the Elavil. If that does not help sleep let me know.   If sleep does not improve consider adding seroquel 12.5 mg at bedtime or Remeron 7.5 mg at bedtime.

## 2022-09-23 ENCOUNTER — Ambulatory Visit: Payer: Medicare PPO | Attending: Family | Admitting: Family

## 2022-09-23 ENCOUNTER — Encounter (HOSPITAL_PSYCHIATRIC): Payer: Self-pay | Admitting: Family

## 2022-09-23 ENCOUNTER — Other Ambulatory Visit: Payer: Self-pay

## 2022-09-23 VITALS — BP 116/62 | HR 71 | Resp 18 | Ht 60.0 in | Wt 138.0 lb

## 2022-09-23 DIAGNOSIS — G479 Sleep disorder, unspecified: Secondary | ICD-10-CM | POA: Insufficient documentation

## 2022-09-23 DIAGNOSIS — F330 Major depressive disorder, recurrent, mild: Secondary | ICD-10-CM | POA: Insufficient documentation

## 2022-09-23 DIAGNOSIS — F410 Panic disorder [episodic paroxysmal anxiety] without agoraphobia: Secondary | ICD-10-CM | POA: Insufficient documentation

## 2022-09-23 MED ORDER — AMITRIPTYLINE 10 MG TABLET
10.0000 mg | ORAL_TABLET | Freq: Every evening | ORAL | 1 refills | Status: DC
Start: 2022-09-23 — End: 2022-12-27

## 2022-09-23 NOTE — Patient Instructions (Signed)
Continue current regimen. Once back to beach try cutting back on xanax or the elavil 10 mg.   Otherwise let me know when you need prescriptions.   Follow-up 3 months. As scheduled.

## 2022-09-23 NOTE — Progress Notes (Signed)
BEHAVIORAL MEDICINE, [facility] [place]  [place]  [place] [id number]  Operated by [facility]  Progress Note    Name: Peggy Pugh MRN:  [id number]   Date: 09/23/2022 DOB:  Jun 15, 1956 (67 y.o.)           Chief Complaint: Panic Disorder and Generalized Anxiety    Subjective:   Ngan is here for one week followup due to her anxiety and issues surrounding sleep. Appt for follow up scheduled as she recently had called requesting earlier appointment.   [place]   Last visit instructed to add elavil 10 mg to her 50 mg dose at bedtime for sleep and anxiety as 75 mg was too much for her.   She also has her xanax 1 mg may take up to 3 times a day for anxiety.   Takes elavil 60 mg total at night with 0.5 mg xanax. Does not feel hung over.   Feels daytime anxiety has decreased some with the increase in elavil, today a little shaky.   Plans to return to [place] this week to stay for a little while. Worried about health of son.   NO SI/HI/AVH  Objective :  BP 116/62 (Site: Left, Patient Position: Sitting)   Pulse 71   Resp 18   Ht 1.524 m (5')   Wt 62.6 kg (138 lb)   BMI 26.95 kg/m     Denies dizziness, palpitations, syncope.    Alert and oriented x4.  Casual dress, calm, well groomed.  No SI /HI/AVH, delusions, or paranoia. Thoughts are logical, coherent, goal directed.  Good eye contact. Speech is normal rate and tone.  Mood is ``ok affect congruent.  No psychomotor agitation or psychomotor retardation, no cogwheel rigidity or abnormal movements.  Gait is normal.  Attention is good.  Concentration and memory good.  No cognitive deficits noted.  Judgment fair, insight fair.  Calculation and abstraction are within normal limits.      PDMP: Appropriate  PHQ9: 0  Data reviewed:    Current Outpatient Medications   Medication Sig    ALPRAZolam (XANAX) 1 mg Oral Tablet Take 1 Tablet (1 mg total) by mouth Every 8 hours as needed  for Anxiety    amitriptyline (ELAVIL) 10 mg Oral Tablet Take 1 Tablet (10 mg total) by mouth Every night Take with 50 mg Indications: difficulty sleeping    amitriptyline (ELAVIL) 50 mg Oral Tablet Take 1 Tablet (50 mg total) by mouth Every night for 90 days Indications: anxious, depression    atenoloL (TENORMIN) 50 mg Oral Tablet Take 1 Tablet (50 mg total) by mouth Once a day    esomeprazole magnesium (NEXIUM) 40 mg Oral Capsule, Delayed Release(E.C.) Take 1 Capsule (40 mg total) by mouth Once a day    fluticasone propionate (FLONASE) 50 mcg/actuation Nasal Spray, Suspension Administer 2 Sprays into each nostril Once per day as needed for Other (Nasal congestion)    Lactobac 40-Bifido 3-S.thermop 100 billion cell Oral Capsule Take 1 Capsule by mouth Once a day    magnesium oxide (MAG-OX) 400 mg Oral Tablet Take 1 Tablet (400 mg total) by mouth Every night     Assessment/Plan  Problem List Items Addressed This Visit          Psychiatric    Panic attacks (Chronic)     Other Visit Diagnoses       Sleep disturbance    -  Primary    Mild episode of recurrent major depressive disorder (CMS HCC)              Moderate level of medical decision making, discussion of multiple diagnosis and symptoms, review of PHQ-9, review of symptoms, patient education, discussion of prescribed medications and potential side effects, discussion of psychosocial stressors, and review of prescription monitoring program information.   Continue current regimen. Once back to beach try cutting back on xanax or the elavil 10 mg.   Otherwise let me know when you need prescriptions.   Follow-up 3 months. As scheduled.    [name], PMHNP-BC  09/23/2022 14:26

## 2022-09-29 ENCOUNTER — Telehealth (HOSPITAL_PSYCHIATRIC): Payer: Self-pay | Admitting: Family

## 2022-09-29 NOTE — Telephone Encounter (Signed)
Pt reports she was able to sleep for 5 days but for the last 4 days is unable to sleep. Un- able to sleep since last Thursday. Pt reports taking her 1/2 xanax and 60 mg amitriptyline but still unable to sleep.

## 2022-09-29 NOTE — Telephone Encounter (Signed)
Patient returned call, Relates not sleeping, has not changed diet other than less caffeine.     When I don't sleep worries about being able to sleep.   Relates going 4 nights without sleep, "maybe fall asleep and wake right back up. " She is afraid to try another medication.   Advised before when she took 75 mg of amitriptyline before was too groggy. Advised if the increase in meds does not help will need to consider bipolar disorder and changing medications.   "I took a test, told was panic and not bipolar. " Denies new stressors. She will let us know.

## 2022-09-29 NOTE — Telephone Encounter (Signed)
Call to patient. Voicemail.

## 2022-10-05 ENCOUNTER — Encounter (HOSPITAL_PSYCHIATRIC): Payer: Self-pay | Admitting: Family

## 2022-10-05 NOTE — Telephone Encounter (Signed)
Patient called and left message asking if her Elavil 10 mg was sent in. I called [facility] and verified both doses were there her 50 mg and 10 mg. When i called patient back to tell her she said you had discussed bumping it up to 2 10 mg with the 50 mg at night. I let patient know all I could see is the orders for 60 mg total at night but I would put a message in to you for when you returned to work . Thank You

## 2022-10-22 ENCOUNTER — Encounter (HOSPITAL_PSYCHIATRIC): Payer: Self-pay | Admitting: Family

## 2022-10-22 NOTE — Telephone Encounter (Signed)
Patient called and wants to know if its safe for her to take a whole xanax ( ) at night with her 70 mg amitriptyline. Patient also wanted to let you know [place]'s currently at the beach. Happy to call patient back with advice/orders. Thank You

## 2022-12-01 ENCOUNTER — Other Ambulatory Visit (HOSPITAL_PSYCHIATRIC): Payer: Self-pay | Admitting: Family

## 2022-12-01 NOTE — Telephone Encounter (Signed)
Patient called and said she has been taking 70 mg of Amitriptyline and it has been doing well with half of her Xanax. Patient says when she went to pick up her refill it still says take 1 10 mg with 50 mg of Amitriptyline. I built a prescription for 2 and put the pharmacy she wants it sent too in [place]. If you don't mind to approve or deny. Thank You

## 2022-12-02 MED ORDER — AMITRIPTYLINE 10 MG TABLET
20.0000 mg | ORAL_TABLET | Freq: Every evening | ORAL | 1 refills | Status: DC
Start: 2022-12-02 — End: 2022-12-27

## 2022-12-13 ENCOUNTER — Telehealth (HOSPITAL_PSYCHIATRIC): Payer: Self-pay | Admitting: Family

## 2022-12-13 MED ORDER — ALPRAZOLAM 1 MG TABLET
1.0000 mg | ORAL_TABLET | Freq: Three times a day (TID) | ORAL | 1 refills | Status: DC | PRN
Start: 2022-12-13 — End: 2022-12-27

## 2022-12-13 NOTE — Telephone Encounter (Signed)
Attempt to review PDMP [place], [place], [place]. [place] would not pull. RX sent.

## 2022-12-13 NOTE — Telephone Encounter (Signed)
Patient called requesting a prescription for xanax to be sent to [facility] in [place], I couldn't see where a recent prescription was sent.   Thank you.

## 2022-12-27 ENCOUNTER — Other Ambulatory Visit: Payer: Self-pay

## 2022-12-27 ENCOUNTER — Encounter (HOSPITAL_PSYCHIATRIC): Payer: Self-pay | Admitting: Family

## 2022-12-27 ENCOUNTER — Ambulatory Visit: Payer: Medicare PPO | Attending: Family | Admitting: Family

## 2022-12-27 VITALS — BP 108/72 | Resp 18 | Ht 60.0 in | Wt 141.0 lb

## 2022-12-27 DIAGNOSIS — F330 Major depressive disorder, recurrent, mild: Secondary | ICD-10-CM | POA: Insufficient documentation

## 2022-12-27 DIAGNOSIS — G479 Sleep disorder, unspecified: Secondary | ICD-10-CM | POA: Insufficient documentation

## 2022-12-27 DIAGNOSIS — F410 Panic disorder [episodic paroxysmal anxiety] without agoraphobia: Secondary | ICD-10-CM | POA: Insufficient documentation

## 2022-12-27 DIAGNOSIS — F411 Generalized anxiety disorder: Secondary | ICD-10-CM | POA: Insufficient documentation

## 2022-12-27 MED ORDER — AMITRIPTYLINE 10 MG TABLET
20.0000 mg | ORAL_TABLET | Freq: Every evening | ORAL | 1 refills | Status: DC
Start: 2022-12-27 — End: 2023-02-24

## 2022-12-27 MED ORDER — AMITRIPTYLINE 50 MG TABLET
50.0000 mg | ORAL_TABLET | Freq: Every evening | ORAL | 1 refills | Status: DC
Start: 2022-12-27 — End: 2023-02-24

## 2022-12-27 MED ORDER — ALPRAZOLAM 1 MG TABLET
1.0000 mg | ORAL_TABLET | Freq: Three times a day (TID) | ORAL | 2 refills | Status: AC | PRN
Start: 2022-12-27 — End: 2023-03-27

## 2022-12-27 NOTE — Progress Notes (Signed)
BEHAVIORAL MEDICINE, [facility] [place]  [address]  [place] [address]  Operated by [facility]  Progress Note    Name: Peggy Pugh MRN:  [id number]   Date: 12/27/2022 DOB:  [birth year] (67 y.o.)             Chief Complaint: Panic Disorder and Generalized Anxiety  Subjective   Subjective:   Here for followup. Relates she tried decreasing xanax at night in April but then the anxiety worsened. Now taking 1 tab twice a day and a half at night.   Elavil total of 70 mg at night.   Has decided that a lot of her anxiety is due to worries regarding her adult son who has special needs. He does not want to be in this and prefers to stay at the beach.   Another fear is discussing selling home locally. Looking for a home at the beach has worsened her anxiety.   Expresses desire to decrease xanax.   Denies SI/HI/ AVH.      Objective   Objective :  BP 108/72 (Site: Left, Patient Position: Sitting)   Resp 18   Ht 1.524 m (5')   Wt 64 kg (141 lb)   BMI 27.54 kg/m     Denies chest pain, palpitations, syncope, shortness of breath, new skin rashes.      Alert and oriented x4.  Casual dress, calm, well groomed.  No SI /HI/AVH, delusions, or paranoia. Thoughts are logical, coherent, goal directed.  Good eye contact. Speech is normal rate and tone.  Mood is ``ok affect congruent.  No psychomotor agitation or psychomotor retardation, no cogwheel rigidity or abnormal movements.  Gait is normal.  Attention is good.  Concentration and memory good.  No cognitive deficits noted.  Judgment fair, insight fair.  Calculation and abstraction are within normal limits.     PDMP: [place] not show [place].   PHQ9: 0    Data reviewed:    Current Outpatient Medications   Medication Sig    ALPRAZolam (XANAX) 1 mg Oral Tablet Take 1 Tablet (1 mg total) by mouth Every 8 hours as needed for Anxiety for up to 60 days Indications: anxious    amitriptyline (ELAVIL) 10 mg Oral Tablet Take 1 Tablet  (10 mg total) by mouth Every night for 180 days Take with 50 mg Indications: difficulty sleeping    amitriptyline (ELAVIL) 10 mg Oral Tablet Take 2 Tablets (20 mg total) by mouth Every night for 180 days Take 2 tabs with 50 mg of Amitriptyline nightly Indications: anxious, difficulty sleeping    amitriptyline (ELAVIL) 50 mg Oral Tablet Take 1 Tablet (50 mg total) by mouth Every night for 90 days Indications: anxious, depression    atenoloL (TENORMIN) 50 mg Oral Tablet Take 1 Tablet (50 mg total) by mouth Once a day    esomeprazole magnesium (NEXIUM) 40 mg Oral Capsule, Delayed Release(E.C.) Take 1 Capsule (40 mg total) by mouth Once a day    fluticasone propionate (FLONASE) 50 mcg/actuation Nasal Spray, Suspension Administer 2 Sprays into each nostril Once per day as needed for Other (Nasal congestion)    Lactobac 40-Bifido 3-S.thermop 100 billion cell Oral Capsule Take 1 Capsule by mouth Once a day    magnesium oxide (MAG-OX) 400 mg Oral Tablet Take 1 Tablet (400 mg total) by mouth Every night        Assessment/Plan  Problem List Items Addressed This Visit  Psychiatric    Panic attacks - Primary (Chronic)    GAD (generalized anxiety disorder)     Other Visit Diagnoses       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   Patient is doing well on current regimen.   If you decide to decrease xanax I would first recommend decreasing mid day dose to 1/2 tab. Then if doing well we can discuss further tapering of xanax.   Patient discusses concerns regarding her weight, discussed previous prescriptions for diet pills, advised could worsen anxiety.   Continue  Elavil total of 70 mg at night for mood and sleep.   Avoid alcohol and illicit drugs. If you have issues or concerns, thoughts of hurting self or others or adverse medication reactions please call us or go to ER.  Outpatient clinic [facility]: [phone].     If mood worsens or does not improve please let us know.   Follow-up 4 months.     Candie Mile, PMHNP-BC  12/27/2022 13:03

## 2022-12-27 NOTE — Patient Instructions (Signed)
If you decide to decrease xanax I would first recommend decreasing mid day dose to 1/2 tab. Then if doing well we can discuss further tapering of xanax.   Patient discusses concerns regarding her weight, discussed previous prescriptions for diet pills, advised could worsen anxiety.   Continue Elavil total of 70 mg at night for mood and sleep.   Avoid alcohol and illicit drugs. If you have issues or concerns, thoughts of hurting self or others or adverse medication reactions please call us or go to ER.  Outpatient clinic [facility] [place]: [phone].     If mood worsens or does not improve please let us know.   Follow-up 4 months.

## 2022-12-31 ENCOUNTER — Ambulatory Visit (HOSPITAL_PSYCHIATRIC): Payer: Self-pay | Admitting: Family

## 2023-02-24 ENCOUNTER — Encounter (HOSPITAL_PSYCHIATRIC): Payer: Self-pay | Admitting: Family

## 2023-02-24 ENCOUNTER — Other Ambulatory Visit (HOSPITAL_PSYCHIATRIC): Payer: Self-pay | Admitting: Family

## 2023-02-24 MED ORDER — AMITRIPTYLINE 10 MG TABLET
20.0000 mg | ORAL_TABLET | Freq: Every evening | ORAL | 0 refills | Status: DC
Start: 2023-02-24 — End: 2023-05-18

## 2023-02-24 MED ORDER — AMITRIPTYLINE 50 MG TABLET
50.0000 mg | ORAL_TABLET | Freq: Every evening | ORAL | 0 refills | Status: DC
Start: 2023-02-24 — End: 2023-05-18

## 2023-02-24 NOTE — Telephone Encounter (Signed)
Rx for elavil sent, as is not controlled. She has 3 month of xanax on file at pharmacy.

## 2023-02-24 NOTE — Telephone Encounter (Signed)
Patient called and said she needed refills on Amitriptyline and Xanax. Amitriptyline is good through December.  Will need Xanax refill in September. I didn't build it in case you didn't want to send it in yet.  She's still in [place] also.

## 2023-02-24 NOTE — Telephone Encounter (Signed)
Call to [facility] [place] as PDMP will not pull [place].     Patient has RX on file for Xanax from 12/27/2022 with 2 refills. Xanax last filled 12/13/2022 and 01/19/2023. Where does she want amitriptyline sent?

## 2023-03-18 ENCOUNTER — Telehealth (HOSPITAL_PSYCHIATRIC): Payer: Self-pay | Admitting: Family

## 2023-03-18 NOTE — Telephone Encounter (Signed)
Peggy Pugh is wanting to let you know she wants to follow you wherever you go when you leave. Please call her at [phone] when you know where you're going to be. She is going to need refills in November and wants to be able to see you when she comes back home from the beach.

## 2023-05-09 ENCOUNTER — Ambulatory Visit (HOSPITAL_PSYCHIATRIC): Payer: Self-pay | Admitting: Family

## 2023-05-10 ENCOUNTER — Other Ambulatory Visit (HOSPITAL_PSYCHIATRIC): Payer: Self-pay | Admitting: Family

## 2023-05-10 NOTE — Telephone Encounter (Signed)
This patient previously seen [name], now to see Peggy Pugh, has an apt on 11/13. Reece Levy is out of the office until Thursday if you don't care to approve or deny this prescription. Thank you.

## 2023-05-18 ENCOUNTER — Other Ambulatory Visit: Payer: Self-pay

## 2023-05-18 ENCOUNTER — Ambulatory Visit: Payer: Medicare PPO | Attending: PHYSICIAN ASSISTANT | Admitting: PHYSICIAN ASSISTANT

## 2023-05-18 ENCOUNTER — Encounter (HOSPITAL_PSYCHIATRIC): Payer: Self-pay | Admitting: PHYSICIAN ASSISTANT

## 2023-05-18 VITALS — BP 117/81 | HR 84 | Resp 18 | Ht 60.0 in | Wt 146.0 lb

## 2023-05-18 DIAGNOSIS — F410 Panic disorder [episodic paroxysmal anxiety] without agoraphobia: Secondary | ICD-10-CM | POA: Insufficient documentation

## 2023-05-18 DIAGNOSIS — F411 Generalized anxiety disorder: Secondary | ICD-10-CM | POA: Insufficient documentation

## 2023-05-18 DIAGNOSIS — Z79899 Other long term (current) drug therapy: Secondary | ICD-10-CM

## 2023-05-18 MED ORDER — AMITRIPTYLINE 10 MG TABLET
10.0000 mg | ORAL_TABLET | Freq: Every evening | ORAL | 1 refills | Status: DC
Start: 2023-05-18 — End: 2023-06-20

## 2023-05-18 MED ORDER — ALPRAZOLAM 1 MG TABLET
1.0000 mg | ORAL_TABLET | Freq: Three times a day (TID) | ORAL | 3 refills | Status: DC | PRN
Start: 2023-05-18 — End: 2023-08-18

## 2023-05-18 MED ORDER — AMITRIPTYLINE 50 MG TABLET
50.0000 mg | ORAL_TABLET | Freq: Every evening | ORAL | 1 refills | Status: DC
Start: 2023-05-18 — End: 2023-08-18

## 2023-05-18 NOTE — Progress Notes (Signed)
BEHAVIORAL MEDICINE, [facility] [place]  [address]  [place] [address]  Operated by [facility]  Progress Note    Name: Peggy Pugh MRN:  [id number]   Date: 05/18/2023 DOB:  17-Apr-1956 (66 y.o.)           Chief Complaint: Generalized Anxiety and Panic Attack    Subjective:   Patient reports that they live here in the fall and winter, then at [facility] in the spring and summer. She has a disabled son. They were looking for a house, but there is no way to afford it now. She misses her church down there the most. She was raised Pentacostal, but feels there is no denomination. She and her son go together. Her husband is a Control and instrumentation engineer, so they go to separate churches. They have been married for 14 years, so they don't talk about church.She has been having difficulty with sciatica in her right leg. She just had her last session two weeks ago with the Chiropractor. They want her to have a MRI.   Mood "good".  Medications: Working well with no ill effects.  Appetite: She has GI issues, so has to be careful with medications and foods.  Sleep: She sleeps well with the medication.  Energy: The Sciatica has caused her to decrease her activity.  Stressors: Physical pain.    Patient Active Problem List   Diagnosis    GAD (generalized anxiety disorder)    Panic attacks     Past Medical History:   Diagnosis Date    Acid reflux     Generalized anxiety disorder     Irregular heart rate     Panic attacks 10/19/2021     Past Surgical History:   Procedure Laterality Date    HX CARPAL TUNNEL RELEASE      HX ROTATOR CUFF REPAIR       Family History   Problem Relation Name Age of Onset    Hypertension (High Blood Pressure) Mother      Anxiety Mother      Diabetes Brother       Social History     Socioeconomic History    Marital status: Married   Tobacco Use    Smoking status: Former     Types: Cigarettes     Passive exposure: Never    Smokeless tobacco: Never   Vaping Use     Vaping status: Never Used   Substance and Sexual Activity    Alcohol use: Never    Drug use: Never     Social Determinants of Health     Financial Resource Strain: Low Risk  (10/18/2022)    Received from [facility]    Overall Financial Resource Strain (CARDIA)     Difficulty of Paying Living Expenses: Not very hard   Transportation Needs: No Transportation Needs (10/18/2022)    Received from [facility], [facility]    PRAPARE - Transportation     Lack of Transportation (Medical): No     Lack of Transportation (Non-Medical): No   Social Connections: Socially Integrated (10/18/2022)    Received from [facility]    Social Network     How would you rate your social network (family, work, friends)?: Good participation with social networks   Intimate Partner Violence: Not At Risk (10/18/2022)    Received from [facility], [facility]    HITS     Over the last 12 months how  often did your partner physically hurt you?: Never     Over the last 12 months how often did your partner insult you or talk down to you?: Never     Over the last 12 months how often did your partner threaten you with physical harm?: Never     Over the last 12 months how often did your partner scream or curse at you?: Never   Housing Stability: Low Risk  (10/18/2022)    Received from [facility], [facility]    Housing Stability Vital Sign     Unable to Pay for Housing in the Last Year: No     Number of Places Lived in the Last Year: 1     Unstable Housing in the Last Year: No      5-hydroxytryptophan, Atorvastatin, Clindamycin, Robaxin [methocarbamol], Bactrim [sulfamethoxazole-trimethoprim], and Tramadol   Current Outpatient Medications   Medication Sig    ALPRAZolam (XANAX) 1 mg Oral Tablet Take 1 Tablet (1 mg total) by mouth Every 8 hours as needed for Anxiety for up to 30 days    amitriptyline (ELAVIL) 10 mg Oral Tablet Take 2 Tablets (20 mg total) by mouth Every night for 180 days Take 2 tabs with 50 mg  of Amitriptyline nightly Indications: anxious, difficulty sleeping    amitriptyline (ELAVIL) 50 mg Oral Tablet Take 1 Tablet (50 mg total) by mouth Every night for 180 days Indications: anxious, depression    atenoloL (TENORMIN) 50 mg Oral Tablet Take 1 Tablet (50 mg total) by mouth Once a day    cranberry fruit (CRANBERRY) 450 mg Oral Tablet Take 2 Tablets (900 mg total) by mouth Three times daily with meals    esomeprazole magnesium (NEXIUM) 40 mg Oral Capsule, Delayed Release(E.C.) Take 1 Capsule (40 mg total) by mouth Once a day (Patient not taking: Reported on 05/18/2023)    fluticasone propionate (FLONASE) 50 mcg/actuation Nasal Spray, Suspension Administer 2 Sprays into each nostril Once per day as needed for Other (Nasal congestion)    Lactobac 40-Bifido 3-S.thermop 100 billion cell Oral Capsule Take 1 Capsule by mouth Once a day    magnesium oxide (MAG-OX) 400 mg Oral Tablet Take 1 Tablet (400 mg total) by mouth Every night    pantoprazole (PROTONIX) 40 mg Oral Tablet, Delayed Release (E.C.) Take 1 Tablet (40 mg total) by mouth Once a day      Objective :  BP 117/81 (Site: Left Arm, Patient Position: Sitting)   Pulse 84   Resp 18   Ht 1.524 m (5')   Wt 66.2 kg (146 lb)   BMI 28.51 kg/m       PHQ Total Score  PHQ 2 Total: 0  PHQ 9 Total: 0  Interpretation of Total Score: 0-4 No depression             09/23/2022     2:10 PM 12/27/2022    12:43 PM 05/18/2023    10:08 AM   Most Recent PHQ-9 Scores   PHQ 9 Total 0 0 0        Mental Status Exam  AXOX4. Casual dress, calm, well-groomed. No SI, HI, AVH, delusions, or paranoia. Thoughts are logical, coherent, and goal-directed. Good eye contact. Speech is normal in rate and tone. Mood is "good" affect congruent. No psychomotor agitation or retardation, cogwheel rigidity, or abnormal movements. Gait is normal. Attention, concentration, and memory are good. No cognitive deficits noted. Judgment and insight are fair. Calculation and abstraction are within normal  limits.  Data Reviewed  I have reviewed patient's previous note medical, surgical, family, and social history in detail today,     Assessment  Generalized Anxiety and Panic Attack    Plan  Continue current medications:  Xanax 1 mg tid/prn for anxiety.  Decrease Elavil from 20 to 10 mg qhs for mood/sleep.  Elavil 50 mg qhs for mood/sleep.      Follow up  Return to clinic in 3 months.    Dawayne Cirri, PA-C

## 2023-05-18 NOTE — Patient Instructions (Signed)
Take medications as prescribed, avoid drugs and alcohol, call office if symptoms worsen or problems arise.  [phone].

## 2023-05-25 IMAGING — MR MRI LUMBAR SPINE WITHOUT CONTRAST
4 of 6 series · 31 of 48 positions shown · IV contrast (gadolinium)
Comparison: None available.

﻿EXAM:  [id number]   MRI LUMBAR SPINE WITHOUT CONTRAST
INDICATION: 66-year-old with history of scoliosis.  Back pain, radiculopathy right side. No history of malignancy or back surgery.
TECHNIQUE: Multiplanar, multisequential MRI of the lumbosacral spine was performed without gadolinium contrast.

[Series 8: T2 · sagittal · 4.5mm · 0.94mm/px · 6 of 13 slices shown (1 of 3)]
[im 1/13]
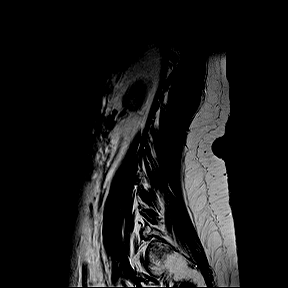
[im 3/13]
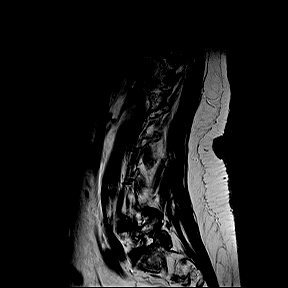
[im 5/13]
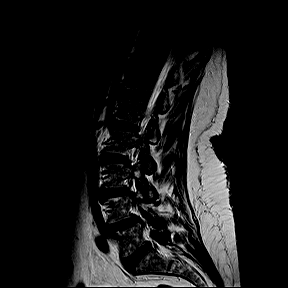
[im 8/13]
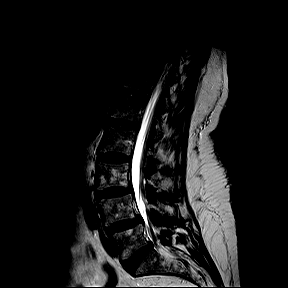
[im 10/13]
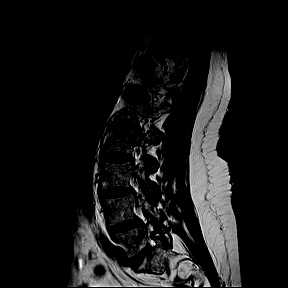
[im 13/13]
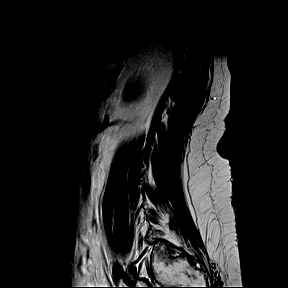

[Series 9: T1 · sagittal · 4.5mm · 0.94mm/px · 6 of 13 slices shown]
[im 1/13]
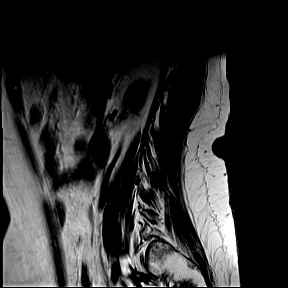
[im 3/13]
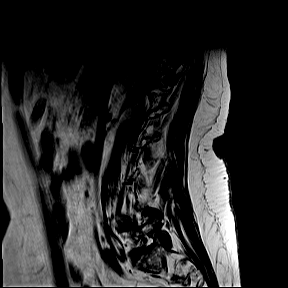
[im 5/13]
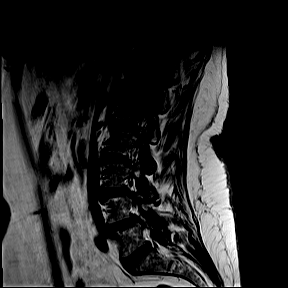
[im 8/13]
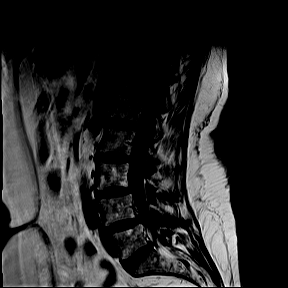
[im 10/13]
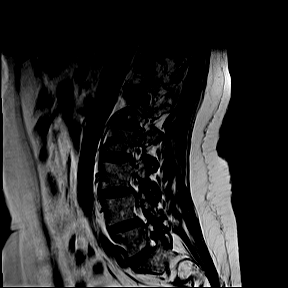
[im 13/13]
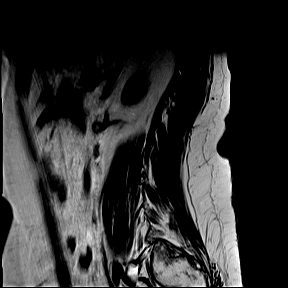

[Series 11: T2 · coronal · 5.0mm · 0.82mm/px · 8 of 18 slices shown (2 of 3)]
[im 1/18]
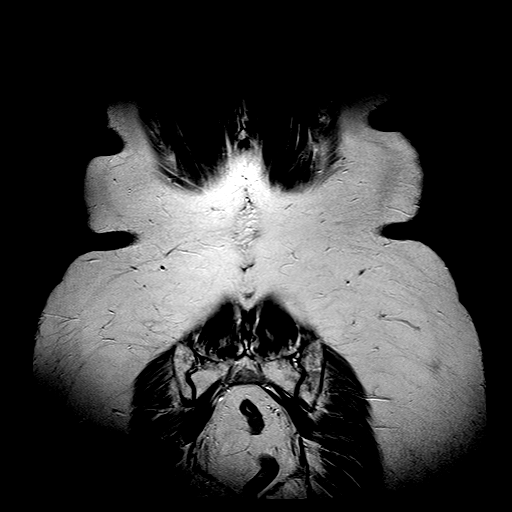
[im 3/18]
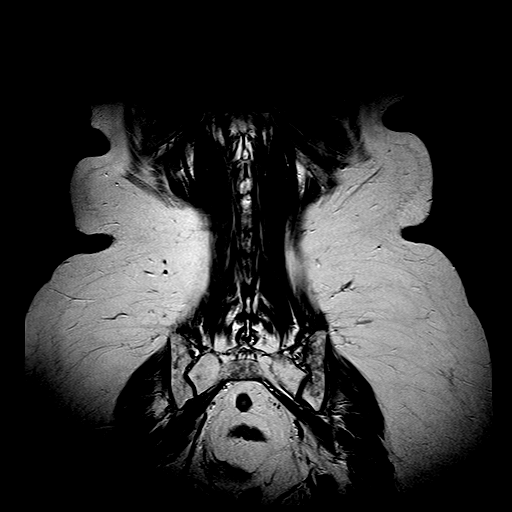
[im 5/18]
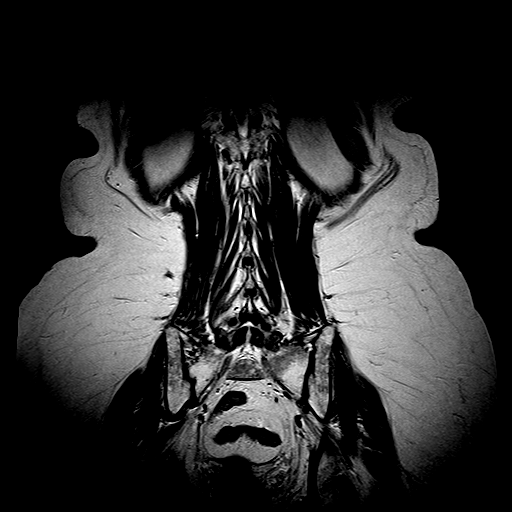
[im 8/18]
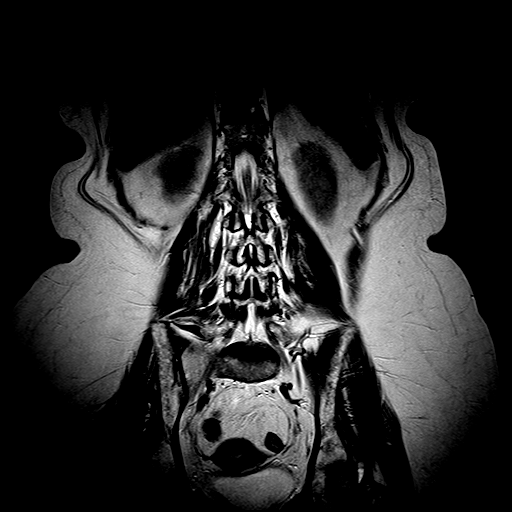
[im 10/18]
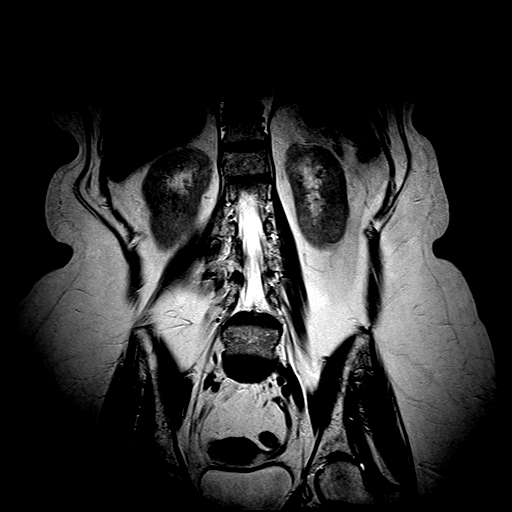
[im 13/18]
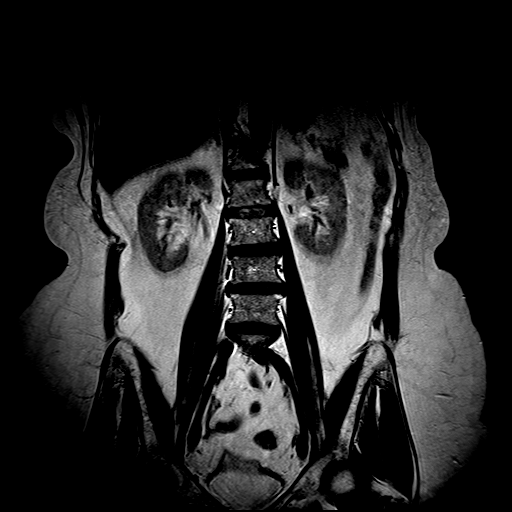
[im 15/18]
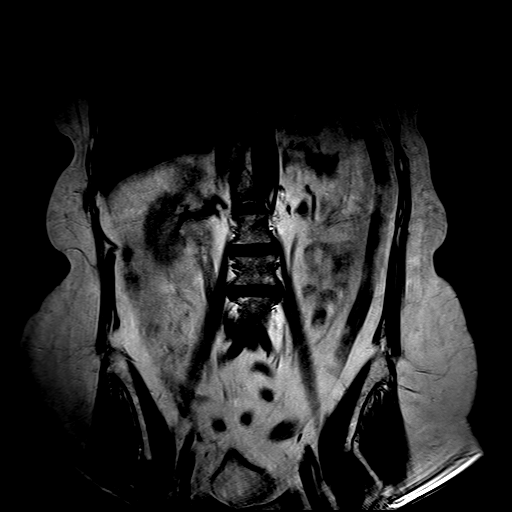
[im 18/18]
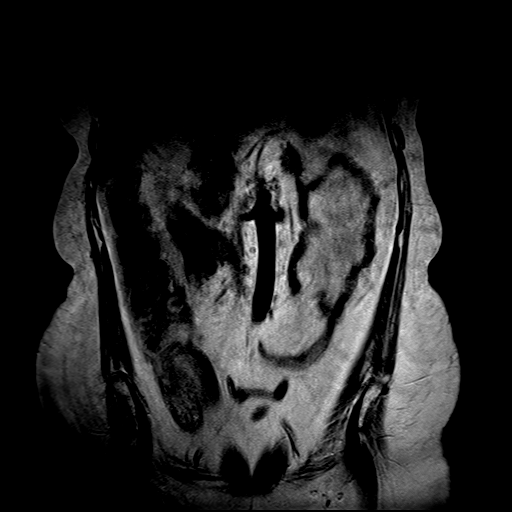

[Series 12: T2 · axial · 4.0mm · 0.52mm/px · z∈[-67,+162]mm · 11 of 24 slices shown (3 of 3)]
[im 1/24]
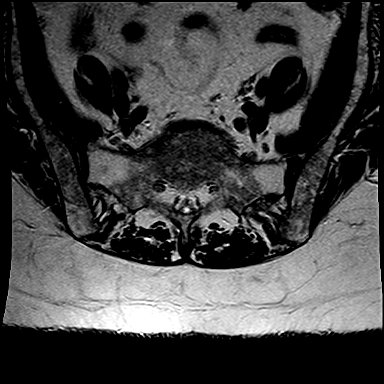
[im 3/24]
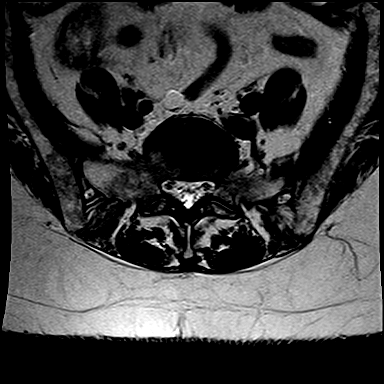
[im 5/24]
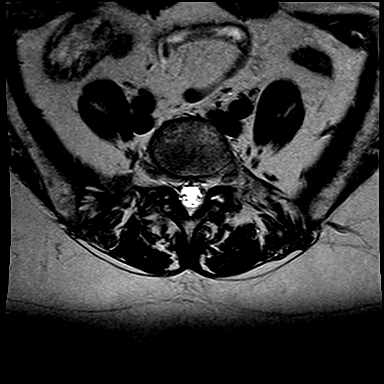
[im 7/24]
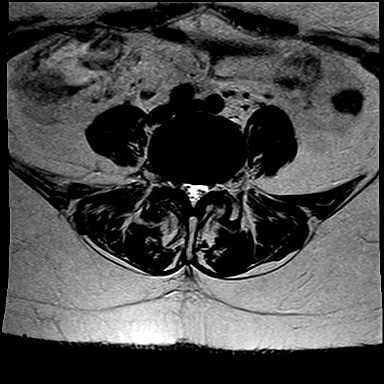
[im 10/24]
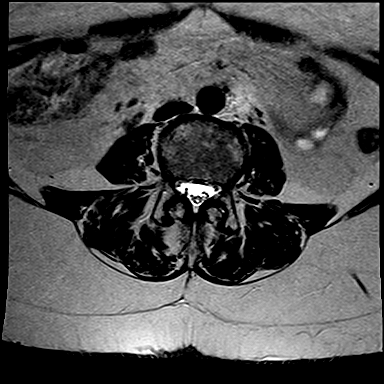
[im 12/24]
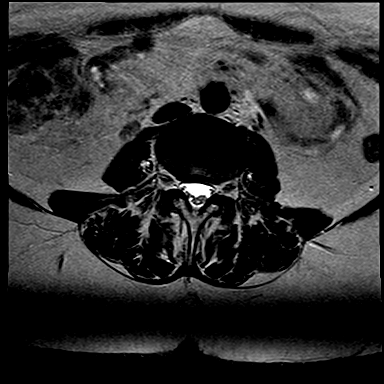
[im 14/24]
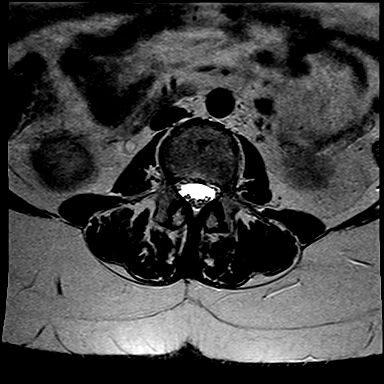
[im 17/24]
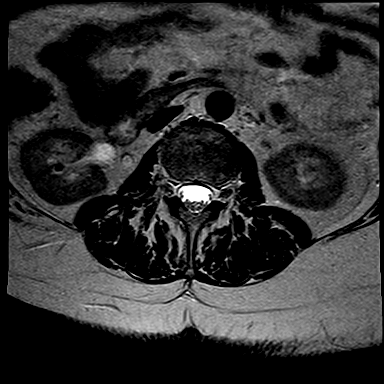
[im 19/24]
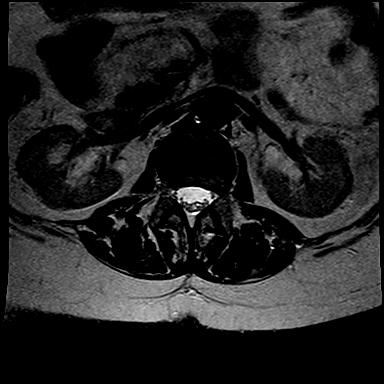
[im 21/24]
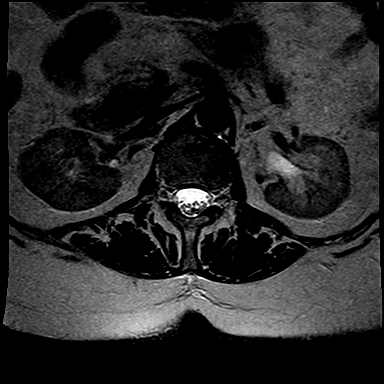
[im 24/24]
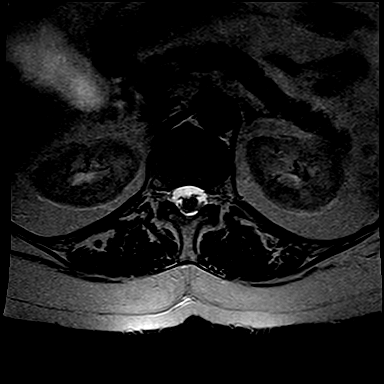

[31 of 48 positions shown; findings below may reference images not displayed]

FINDINGS: No acute bony lesions of lumbar vertebrae. Conus terminates at L1 level. 

At L1-2 disc level, no focal disc lesions are seen. 

At L2-3 level, minimal degenerative disc changes and facet arthropathy causing minimal compromise of both lateral recess. 

At L3-4 level, moderate bilateral facet arthropathy with hypertrophy of ligaments with minimal degenerative anterolisthesis of L3 on L4. These changes along with mild degenerative disc disease are causing mild to moderate compromise of thecal sac and both lateral recess.  AP diameter of thecal sac in the midline measures 10 mm.

At L4-5 level, significant bilateral facet arthropathy with hypertrophy of dorsal ligaments are noted.  There is degenerative first-degree anterolisthesis of L4 on L5 along with degenerative disc disease with asymmetrically bulging annulus to the right.  Significant compromise of right lateral recess and moderate compromise of thecal sac and left lateral recess are noted with AP diameter in the midline measuring 8.3 mm.

At L5-S1 level, significant bilateral facet arthropathy is noted along with degenerative disc changes causing significant compromise of both lateral recess.

Paravertebral soft tissues are unremarkable.
IMPRESSION: 1.  No acute bony lesions of lumbar vertebrae. 

2.  At L4-5 level, significant bilateral facet arthropathy with hypertrophy of dorsal ligaments are noted.  There is evidence of degenerative first-degree anterolisthesis of L4 on L5 along with degenerative disc disease with asymmetrically bulging annulus to the right.  Significant compromise of right lateral recess and moderate compromise of thecal sac and left lateral recess are noted with AP diameter in the midline measuring 8.3 mm.

3. Findings at other levels are described above in detail.

## 2023-05-30 ENCOUNTER — Telehealth (HOSPITAL_PSYCHIATRIC): Payer: Self-pay | Admitting: PHYSICIAN ASSISTANT

## 2023-05-30 NOTE — Telephone Encounter (Signed)
Pt called to report that she has been having more trouble sleeping over the last few weeks. She said she used to only have to take 1/2 tab of her xanax but has been taking a whole lately so she can sleep. Pt is advised that the xanax is ordered every 8 hours prn anxiety. Pt was still concerned about needing a whole tab in order to sleep.  She wanted to let you know.

## 2023-06-17 ENCOUNTER — Encounter (HOSPITAL_PSYCHIATRIC): Payer: Self-pay | Admitting: PHYSICIAN ASSISTANT

## 2023-06-17 ENCOUNTER — Other Ambulatory Visit (HOSPITAL_PSYCHIATRIC): Payer: Self-pay | Admitting: PHYSICIAN ASSISTANT

## 2023-06-17 MED ORDER — TRAZODONE 50 MG TABLET
50.0000 mg | ORAL_TABLET | Freq: Every evening | ORAL | 0 refills | Status: DC
Start: 2023-06-17 — End: 2023-07-17

## 2023-06-17 NOTE — Telephone Encounter (Signed)
Patient called and said she's having trouble sleeping. She made an appointment for Monday.  She was wondering if you think she should try Trazodone again, the bottle at home says Trazodone 50 mg 1 or 2 tabs nightly. If so ill send some in for her. Thanks

## 2023-06-20 ENCOUNTER — Other Ambulatory Visit: Payer: Self-pay

## 2023-06-20 ENCOUNTER — Encounter (HOSPITAL_PSYCHIATRIC): Payer: Self-pay | Admitting: PHYSICIAN ASSISTANT

## 2023-06-20 ENCOUNTER — Ambulatory Visit: Payer: Medicare PPO | Attending: PHYSICIAN ASSISTANT | Admitting: PHYSICIAN ASSISTANT

## 2023-06-20 VITALS — BP 119/67 | HR 91 | Resp 17 | Ht 60.0 in | Wt 137.0 lb

## 2023-06-20 DIAGNOSIS — F410 Panic disorder [episodic paroxysmal anxiety] without agoraphobia: Secondary | ICD-10-CM | POA: Insufficient documentation

## 2023-06-20 DIAGNOSIS — F17200 Nicotine dependence, unspecified, uncomplicated: Secondary | ICD-10-CM

## 2023-06-20 DIAGNOSIS — F411 Generalized anxiety disorder: Secondary | ICD-10-CM | POA: Insufficient documentation

## 2023-06-20 DIAGNOSIS — Z818 Family history of other mental and behavioral disorders: Secondary | ICD-10-CM

## 2023-06-20 MED ORDER — AMITRIPTYLINE 10 MG TABLET
10.0000 mg | ORAL_TABLET | Freq: Every evening | ORAL | 1 refills | Status: DC
Start: 2023-06-20 — End: 2023-08-18

## 2023-06-20 NOTE — Progress Notes (Signed)
BEHAVIORAL MEDICINE, [facility] [place]  [address]  [place] [address]  Operated by [facility]  Progress Note    Name: Peggy Pugh MRN:  [id number]   Date: 06/20/2023 DOB:  [birth year] (67 y.o.)           Chief Complaint: GAD and Panic Attacks.    Subjective:   Patient reports that she keeps a schedule. She is dealing with Sciatica and got in with [facility]. The neuro-surgeon evaluated her and told her she needs an epidural injection. The clinic doesn't have another appointment until February 25th to have the injection. She is in a lot of pain, rated at a 6-7/10, and is only able to take Tylenol. Taking away the Elavil, increased her pain. She increased the Elavil and she slept well last night. Christmas is going to be good. They usually go to [facility] on Christmas Day, but it not sure this year because they are calling for snow.   Mood: "Good".  Medications: Working well with no ill effects, at her regular dose.   Appetite: She is eating fairly well, but is prone to constipation.   Sleep: Improved with Elavil restored to 70 mg.   Energy: Doing well.   Stressors: Pain.     Patient Active Problem List   Diagnosis    GAD (generalized anxiety disorder)    Panic attacks     Past Medical History:   Diagnosis Date    Acid reflux     Generalized anxiety disorder     Irregular heart rate     Panic attacks 10/19/2021     Past Surgical History:   Procedure Laterality Date    HX CARPAL TUNNEL RELEASE      HX ROTATOR CUFF REPAIR       Family History   Problem Relation Name Age of Onset    Hypertension (High Blood Pressure) Mother      Anxiety Mother      Diabetes Brother       Social History     Socioeconomic History    Marital status: Married   Tobacco Use    Smoking status: Former     Types: Cigarettes     Passive exposure: Never    Smokeless tobacco: Never   Vaping Use    Vaping status: Never Used   Substance and Sexual Activity    Alcohol use: Never    Drug  use: Never     Social Determinants of Health     Financial Resource Strain: Low Risk  (10/18/2022)    Received from [facility], [facility]    Overall Financial Resource Strain (CARDIA)     Difficulty of Paying Living Expenses: Not very hard   Transportation Needs: No Transportation Needs (10/18/2022)    Received from [facility], [facility]    PRAPARE - Transportation     Lack of Transportation (Medical): No     Lack of Transportation (Non-Medical): No   Social Connections: Socially Integrated (10/18/2022)    Received from [facility], [facility]    Social Network     How would you rate your social network (family, work, friends)?: Good participation with social networks   Intimate Partner Violence: Not At Risk (10/18/2022)    Received from [facility], [facility]    HITS     Over the last 12 months how often did your partner physically hurt you?: Never     Over the last  12 months how often did your partner insult you or talk down to you?: Never     Over the last 12 months how often did your partner threaten you with physical harm?: Never     Over the last 12 months how often did your partner scream or curse at you?: Never   Housing Stability: Low Risk  (10/18/2022)    Received from [facility], [facility]    Housing Stability Vital Sign     Unable to Pay for Housing in the Last Year: No     Number of Places Lived in the Last Year: 1     Unstable Housing in the Last Year: No      5-hydroxytryptophan, Atorvastatin, Clindamycin, Robaxin [methocarbamol], Bactrim [sulfamethoxazole-trimethoprim], and Tramadol   Current Outpatient Medications   Medication Sig    ALPRAZolam (XANAX) 1 mg Oral Tablet Take 1 Tablet (1 mg total) by mouth Every 8 hours as needed for Anxiety for up to 120 days    amitriptyline (ELAVIL) 10 mg Oral Tablet Take 1 Tablet (10 mg total) by mouth Every night for 180 days Take 2 tabs with 50 mg of Amitriptyline nightly Indications: anxious, difficulty sleeping    amitriptyline  (ELAVIL) 50 mg Oral Tablet Take 1 Tablet (50 mg total) by mouth Every night for 180 days Indications: anxious, depression    atenoloL (TENORMIN) 50 mg Oral Tablet Take 1 Tablet (50 mg total) by mouth Once a day    cranberry fruit (CRANBERRY) 450 mg Oral Tablet Take 2 Tablets (900 mg total) by mouth Three times daily with meals    esomeprazole magnesium (NEXIUM) 40 mg Oral Capsule, Delayed Release(E.C.) Take 1 Capsule (40 mg total) by mouth Once a day (Patient not taking: Reported on 05/18/2023)    fluticasone propionate (FLONASE) 50 mcg/actuation Nasal Spray, Suspension Administer 2 Sprays into each nostril Once per day as needed for Other (Nasal congestion)    Lactobac 40-Bifido 3-S.thermop 100 billion cell Oral Capsule Take 1 Capsule by mouth Once a day    magnesium oxide (MAG-OX) 400 mg Oral Tablet Take 1 Tablet (400 mg total) by mouth Every night    pantoprazole (PROTONIX) 40 mg Oral Tablet, Delayed Release (E.C.) Take 1 Tablet (40 mg total) by mouth Once a day    traZODone (DESYREL) 50 mg Oral Tablet Take 1 Tablet (50 mg total) by mouth Every night for 30 days Take 1 Tablet (50 mg) by mouth Every night, may repeat one time if needed (Patient not taking: Reported on 06/20/2023)      Objective :  BP 119/67   Pulse 91   Resp 17   Ht 1.524 m (5')   Wt 62.1 kg (137 lb)   BMI 26.76 kg/m       PHQ Total Score  PHQ 2 Total: 1  PHQ 9 Total: 4  Interpretation of Total Score: 0-4 No depression             12/27/2022    12:43 PM 05/18/2023    10:08 AM 06/20/2023     8:41 AM   Most Recent PHQ-9 Scores   PHQ 9 Total 0 0 4        Mental Status Exam  AXOX4. Casual dress, calm, well-groomed. No SI, HI, AVH, delusions, or paranoia. Thoughts are logical, coherent, and goal-directed. Good eye contact. Speech is normal in rate and tone. Mood is "good" affect congruent. No psychomotor agitation or retardation, cogwheel rigidity, or abnormal movements. Gait is normal. Attention, concentration,  and memory are good. No cognitive  deficits noted. Judgment and insight are fair. Calculation and abstraction are within normal limits.     Data Reviewed  I have reviewed patient's previous note medical, surgical, family, and social history in detail today,     Assessment  GAD and Panic Attacks.    Plan  Continue current medications:  Xanax 1 mg tid/prn for anxiety.  Increase Elavil from 10 to 20 mg qhs for mood/sleep.  Elavil 50 mg qhs for mood/sleep.  She takes 70 mg total of Elavil and did not do well with the decrease of 10 mg.   Discontinue Trazodone, as per patient, ineffective.      Follow up  Return to clinic as scheduled on August 18, 2023.    Dawayne Cirri, PA-C

## 2023-06-20 NOTE — Patient Instructions (Signed)
Take medications as prescribed, avoid drugs and alcohol, call office if symptoms worsen or problems arise.  [phone].

## 2023-08-18 ENCOUNTER — Ambulatory Visit: Payer: Medicare PPO | Attending: PHYSICIAN ASSISTANT | Admitting: PHYSICIAN ASSISTANT

## 2023-08-18 ENCOUNTER — Encounter (HOSPITAL_PSYCHIATRIC): Payer: Self-pay | Admitting: PHYSICIAN ASSISTANT

## 2023-08-18 ENCOUNTER — Other Ambulatory Visit: Payer: Self-pay

## 2023-08-18 VITALS — BP 112/75 | HR 76 | Ht 60.0 in | Wt 137.0 lb

## 2023-08-18 DIAGNOSIS — G8929 Other chronic pain: Secondary | ICD-10-CM

## 2023-08-18 DIAGNOSIS — F410 Panic disorder [episodic paroxysmal anxiety] without agoraphobia: Secondary | ICD-10-CM | POA: Insufficient documentation

## 2023-08-18 DIAGNOSIS — Z87891 Personal history of nicotine dependence: Secondary | ICD-10-CM

## 2023-08-18 DIAGNOSIS — F411 Generalized anxiety disorder: Secondary | ICD-10-CM | POA: Insufficient documentation

## 2023-08-18 MED ORDER — AMITRIPTYLINE 75 MG TABLET
75.0000 mg | ORAL_TABLET | Freq: Every evening | ORAL | 1 refills | Status: DC
Start: 2023-08-18 — End: 2023-09-12

## 2023-08-18 MED ORDER — ALPRAZOLAM 1 MG TABLET
1.0000 mg | ORAL_TABLET | Freq: Three times a day (TID) | ORAL | 3 refills | Status: DC | PRN
Start: 2023-08-18 — End: 2023-09-12
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
## 2023-09-05 ENCOUNTER — Telehealth (HOSPITAL_PSYCHIATRIC): Payer: Self-pay | Admitting: PHYSICIAN ASSISTANT

## 2023-09-05 NOTE — Telephone Encounter (Signed)
 Peggy Pugh called and said she has not slept in 4 days.  The medicine is not working. She asked if she could come in today but we don't have anything.  I told her I could put in a message to see if you might want to make a med adjustment.  Please call her back.

## 2023-09-06 ENCOUNTER — Telehealth (HOSPITAL_PSYCHIATRIC): Payer: Self-pay | Admitting: PHYSICIAN ASSISTANT

## 2023-09-06 NOTE — Telephone Encounter (Signed)
 Patient called and left a voice mail. Said she tried the Trazodone took one and then took another later and still didn't sleep.  Would you like me to advise her to continue Trazodone until next appointment? Or would you like to adjust something now?  Thank You

## 2023-09-08 ENCOUNTER — Other Ambulatory Visit (HOSPITAL_PSYCHIATRIC): Payer: Self-pay | Admitting: PHYSICIAN ASSISTANT

## 2023-09-08 ENCOUNTER — Encounter (HOSPITAL_PSYCHIATRIC): Payer: Self-pay | Admitting: PHYSICIAN ASSISTANT

## 2023-09-08 MED ORDER — IMIPRAMINE 25 MG TABLET
25.0000 mg | ORAL_TABLET | Freq: Every evening | ORAL | 0 refills | Status: DC
Start: 2023-09-08 — End: 2023-09-12

## 2023-09-08 NOTE — Telephone Encounter (Signed)
 Patient called to check in. Said she took her Trazodone  on Tuesday at 10pm and then took her Xanax at 2 and slept until 2 am.    Wednesday she did the same routine but couldn't fall asleep so she repeated the Trazodone 100 mg again at 1230am and still didn't sleep. Happy to call patient back if you need me too. Thank You      Phone: [phone]

## 2023-09-09 ENCOUNTER — Encounter (HOSPITAL_PSYCHIATRIC): Payer: Self-pay

## 2023-09-09 DIAGNOSIS — F329 Major depressive disorder, single episode, unspecified: Secondary | ICD-10-CM | POA: Insufficient documentation

## 2023-09-12 ENCOUNTER — Other Ambulatory Visit: Payer: Self-pay

## 2023-09-12 ENCOUNTER — Ambulatory Visit: Attending: PHYSICIAN ASSISTANT | Admitting: PHYSICIAN ASSISTANT

## 2023-09-12 ENCOUNTER — Encounter (HOSPITAL_PSYCHIATRIC): Payer: Self-pay | Admitting: PHYSICIAN ASSISTANT

## 2023-09-12 VITALS — BP 127/71 | HR 79 | Resp 18 | Ht 60.0 in | Wt 131.0 lb

## 2023-09-12 DIAGNOSIS — F410 Panic disorder [episodic paroxysmal anxiety] without agoraphobia: Secondary | ICD-10-CM | POA: Insufficient documentation

## 2023-09-12 DIAGNOSIS — F329 Major depressive disorder, single episode, unspecified: Secondary | ICD-10-CM | POA: Insufficient documentation

## 2023-09-12 DIAGNOSIS — F411 Generalized anxiety disorder: Secondary | ICD-10-CM | POA: Insufficient documentation

## 2023-09-12 MED ORDER — IMIPRAMINE 50 MG TABLET
50.0000 mg | ORAL_TABLET | Freq: Every evening | ORAL | 0 refills | Status: DC
Start: 2023-09-12 — End: 2023-09-19

## 2023-09-12 MED ORDER — ALPRAZOLAM 1 MG TABLET
1.0000 mg | ORAL_TABLET | Freq: Three times a day (TID) | ORAL | 3 refills | Status: DC | PRN
Start: 2023-09-12 — End: 2023-09-27

## 2023-09-12 NOTE — Progress Notes (Signed)
 BEHAVIORAL MEDICINE, [facility] [place]  [address]  [place] [address]  Operated by [facility]  Progress Note    Name: Peggy Pugh MRN:  [id number]   Date: 09/12/2023 DOB:  1955-08-16 (68 y.o.)           Chief Complaint: Major Depression, Panic Atacks, and Generalized Anxiety    Subjective:   Patient reports that she started Thursday, slept from 10:30 pm until 5:33 am. Since then, she has had difficulty falling asleep, was up and down and woke up for good at 3:30 am. She had the same difficulty with Elavil, that she had been on for years along with the Xanax. She did receive a pain injection, which was helpful, but quesitioning whether it interfered and canceled out the Elavil. The injection has been successful, in that she has not had any pain.  She feels that the Magnesium is creating diarrhea for her, which is unusual for her because she is usually constipated. She has been spring cleaning and feels okay.   Mood: "good".   Medications: She feels that the sleep medication is helping with her mood and with her sleep. She feels that it should be increased.    Appetite: Eating better with a good appetite.   Sleep: Sleeping a little better with the Imipramine.  Energy: Picked up a little bit during the daytime.   Stressors: They are planning on going to [facility] to her best friend's baby shower on the 7th. They are planning on being back to the beach by April.     Patient Active Problem List   Diagnosis    GAD (generalized anxiety disorder)    Panic attacks    Major depressive disorder     Past Medical History:   Diagnosis Date    Acid reflux     Generalized anxiety disorder     Irregular heart rate     Panic attacks 10/19/2021     Past Surgical History:   Procedure Laterality Date    HX CARPAL TUNNEL RELEASE      HX ROTATOR CUFF REPAIR       Family History   Problem Relation Name Age of Onset    Hypertension (High Blood Pressure) Mother      Anxiety  Mother      Diabetes Brother       Social History     Socioeconomic History    Marital status: Married   Tobacco Use    Smoking status: Former     Types: Cigarettes     Passive exposure: Never    Smokeless tobacco: Never   Vaping Use    Vaping status: Never Used   Substance and Sexual Activity    Alcohol use: Never    Drug use: Never     Social Determinants of Health     Financial Resource Strain: Low Risk  (10/18/2022)    Received from [facility], [facility]    Overall Financial Resource Strain (CARDIA)     Difficulty of Paying Living Expenses: Not very hard   Transportation Needs: No Transportation Needs (10/18/2022)    Received from [facility], [facility]    PRAPARE - Transportation     Lack of Transportation (Medical): No     Lack of Transportation (Non-Medical): No   Social Connections: Socially Integrated (10/18/2022)    Received from [facility], [facility]    Social Network     How would you rate  your social network (family, work, friends)?: Good participation with social networks   Intimate Partner Violence: Not At Risk (10/18/2022)    Received from [facility], [facility]    HITS     Over the last 12 months how often did your partner physically hurt you?: Never     Over the last 12 months how often did your partner insult you or talk down to you?: Never     Over the last 12 months how often did your partner threaten you with physical harm?: Never     Over the last 12 months how often did your partner scream or curse at you?: Never   Housing Stability: Low Risk  (10/18/2022)    Received from [facility], [facility]    Housing Stability Vital Sign     Unable to Pay for Housing in the Last Year: No     Number of Places Lived in the Last Year: 1     Unstable Housing in the Last Year: No      5-hydroxytryptophan, Atorvastatin, Clindamycin, Robaxin [methocarbamol], Bactrim [sulfamethoxazole-trimethoprim], Nsaids (non-steroidal anti-inflammatory drug), and Tramadol   Current Outpatient  Medications   Medication Sig    ALPRAZolam (XANAX) 1 mg Oral Tablet Take 1 Tablet (1 mg total) by mouth Every 8 hours as needed for Anxiety for up to 120 days    amitriptyline (ELAVIL) 75 mg Oral Tablet Take 1 Tablet (75 mg total) by mouth Every night for 180 days Indications: anxious, depression (Patient not taking: Reported on 09/12/2023)    atenoloL (TENORMIN) 50 mg Oral Tablet Take 1 Tablet (50 mg total) by mouth Once a day    cranberry fruit (CRANBERRY) 450 mg Oral Tablet Take 2 Tablets (900 mg total) by mouth Three times daily with meals (Patient taking differently: Take 2 Tablets (900 mg total) by mouth Every morning with breakfast)    esomeprazole magnesium (NEXIUM) 40 mg Oral Capsule, Delayed Release(E.C.) Take 1 Capsule (40 mg total) by mouth Once a day (Patient not taking: Reported on 05/18/2023)    fluticasone propionate (FLONASE) 50 mcg/actuation Nasal Spray, Suspension Administer 2 Sprays into each nostril Once per day as needed for Other (Nasal congestion)    imipramine (TOFRANIL) 25 mg Oral Tablet Take 1 Tablet (25 mg total) by mouth Every night    Lactobac 40-Bifido 3-S.thermop 100 billion cell Oral Capsule Take 1 Capsule by mouth Once a day    magnesium oxide (MAG-OX) 400 mg Oral Tablet Take 1 Tablet (400 mg total) by mouth Every night    pantoprazole (PROTONIX) 40 mg Oral Tablet, Delayed Release (E.C.) Take 1 Tablet (40 mg total) by mouth Once a day      Objective :  BP 127/71 (Site: Left Arm, Patient Position: Sitting)   Pulse 79   Resp 18   Ht 1.524 m (5')   Wt 59.4 kg (131 lb)   BMI 25.58 kg/m       PHQ Total Score  PHQ 2 Total: 0  PHQ 9 Total: 6  Interpretation of Total Score: 5-9 Mild depression             06/20/2023     8:41 AM 08/18/2023    10:32 AM 09/12/2023    10:09 AM   Most Recent PHQ-9 Scores   PHQ 9 Total 4 4 6         Mental Status Exam  AXOX4. Casual dress, calm, well-groomed. No SI, HI, AVH, delusions, or paranoia. Thoughts are logical, coherent, and goal-directed.  Good eye  contact. Speech is normal in rate and tone. Mood is "okay" affect congruent. No psychomotor agitation or retardation, cogwheel rigidity, or abnormal movements. Gait is normal. Attention, concentration, and memory are good. No cognitive deficits noted. Judgment and insight are fair. Calculation and abstraction are within normal limits.     Data Reviewed  I have reviewed patient's previous note medical, surgical, family, and social history in detail today,     Assessment  Major Depression, Panic Atacks, and Generalized Anxiety    Plan  Continue current medications:  Xanax 1 mg tid/prn for anxiety.  Increase Elavil from 70 to 75 mg nightly for mood/sleep.    Series of phone calls since last appointment.   Discontinued Elavil, due to ineffectiveness.  Re-started Trazodone, no effective, discontinued.  Started Imipramine 25 mg qhs for mood/sleep.    Increase Imipramine from 25 to 50 qhs for mood/sleep.      Follow up  Return in about 1 week (around 09/19/2023) for In Person Visit.     Dawayne Cirri, PA-C

## 2023-09-12 NOTE — Patient Instructions (Signed)
 Take medications as prescribed, avoid drugs and alcohol, call office if symptoms worsen or problems arise.  [phone].

## 2023-09-15 ENCOUNTER — Ambulatory Visit (HOSPITAL_PSYCHIATRIC): Payer: Self-pay | Admitting: PHYSICIAN ASSISTANT

## 2023-09-15 ENCOUNTER — Encounter (HOSPITAL_PSYCHIATRIC): Payer: Self-pay | Admitting: PHYSICIAN ASSISTANT

## 2023-09-15 NOTE — Telephone Encounter (Signed)
 Patient called to give report about her sleeping. She reports that she fell asleep Monday but couldn't stay asleep. On Tuesday she couldn't sleep at all until around 330 and then slept for only one hour. Then went to sleep again after 5 for 1 hour. Last night on Wednesday she fell asleep but again couldn't stay asleep. Patients asked that if you increase her pill to only send 5 in at the moment. Happy to call patient back with any advice/orders you may have. Thank You

## 2023-09-19 ENCOUNTER — Ambulatory Visit: Attending: PHYSICIAN ASSISTANT | Admitting: PHYSICIAN ASSISTANT

## 2023-09-19 ENCOUNTER — Encounter (HOSPITAL_PSYCHIATRIC): Payer: Self-pay | Admitting: PHYSICIAN ASSISTANT

## 2023-09-19 ENCOUNTER — Other Ambulatory Visit: Payer: Self-pay

## 2023-09-19 VITALS — BP 120/77 | HR 90 | Resp 18 | Ht 61.0 in | Wt 131.0 lb

## 2023-09-19 DIAGNOSIS — F411 Generalized anxiety disorder: Secondary | ICD-10-CM | POA: Insufficient documentation

## 2023-09-19 DIAGNOSIS — Z87891 Personal history of nicotine dependence: Secondary | ICD-10-CM

## 2023-09-19 DIAGNOSIS — F410 Panic disorder [episodic paroxysmal anxiety] without agoraphobia: Secondary | ICD-10-CM | POA: Insufficient documentation

## 2023-09-19 DIAGNOSIS — F329 Major depressive disorder, single episode, unspecified: Secondary | ICD-10-CM | POA: Insufficient documentation

## 2023-09-19 DIAGNOSIS — Z818 Family history of other mental and behavioral disorders: Secondary | ICD-10-CM

## 2023-09-19 DIAGNOSIS — G4700 Insomnia, unspecified: Secondary | ICD-10-CM

## 2023-09-19 NOTE — Patient Instructions (Signed)
 Take medications as prescribed, avoid drugs and alcohol, call office if symptoms worsen or problems arise.  [phone].

## 2023-09-19 NOTE — Progress Notes (Signed)
 BEHAVIORAL MEDICINE, [facility] [place]  [address]  [place] [address]  Operated by [facility]  Progress Note    Name: Peggy Pugh MRN:  [id number]   Date: 09/19/2023 DOB:  04-04-1956 (67 y.o.)           Chief Complaint: Major Depression, Generalized Anxiety, Panic Attacks, and Insomnia    Subjective:   Patient reports that she has not been sleeping, except off and on. Saturday night she was up all night, last night she slept some. It seems that it is causing urinary retention and a runny nose. In 1993, she started the Amitriptyline and never had a problem with sleep  until recently. Eber Jones raised them to 70 mg. She didn't have a problem until she had the Epidural. She has had the Flu and COVID, as well as increased pain levels prior to the injections.    Mood: "discouraged", but okay.  Medications  Xanax has been effective, but Imipramine has not been effective.   Appetite: She has lost more weight, but it is not because of diet, it is due to the Imipramine.  Sleep: Still not sleeping well.   Energy: Poor due to lack of sleep.   Stressors: Lack of sleep.    Patient Active Problem List   Diagnosis    GAD (generalized anxiety disorder)    Panic attacks    Major depressive disorder     Past Medical History:   Diagnosis Date    Acid reflux     Generalized anxiety disorder     Irregular heart rate     Panic attacks 10/19/2021     Past Surgical History:   Procedure Laterality Date    HX CARPAL TUNNEL RELEASE      HX ROTATOR CUFF REPAIR       Family History   Problem Relation Name Age of Onset    Hypertension (High Blood Pressure) Mother      Anxiety Mother      Diabetes Brother       Social History     Socioeconomic History    Marital status: Married   Tobacco Use    Smoking status: Former     Types: Cigarettes     Passive exposure: Never    Smokeless tobacco: Never   Vaping Use    Vaping status: Never Used   Substance and Sexual Activity    Alcohol use:  Never    Drug use: Never     Social Determinants of Health     Financial Resource Strain: Low Risk  (10/18/2022)    Received from [facility], [facility]    Overall Financial Resource Strain (CARDIA)     Difficulty of Paying Living Expenses: Not very hard   Transportation Needs: No Transportation Needs (10/18/2022)    Received from [facility], [facility]    PRAPARE - Transportation     Lack of Transportation (Medical): No     Lack of Transportation (Non-Medical): No   Social Connections: Socially Integrated (10/18/2022)    Received from [facility], [facility]    Social Network     How would you rate your social network (family, work, friends)?: Good participation with social networks   Intimate Partner Violence: Not At Risk (10/18/2022)    Received from [facility], [facility]    HITS     Over the last 12 months how often did your partner physically hurt you?: Never  Over the last 12 months how often did your partner insult you or talk down to you?: Never     Over the last 12 months how often did your partner threaten you with physical harm?: Never     Over the last 12 months how often did your partner scream or curse at you?: Never   Housing Stability: Low Risk  (10/18/2022)    Received from [facility], [facility]    Housing Stability Vital Sign     Unable to Pay for Housing in the Last Year: No     Number of Places Lived in the Last Year: 1     Unstable Housing in the Last Year: No      5-hydroxytryptophan, Atorvastatin, Clindamycin, Robaxin [methocarbamol], Bactrim [sulfamethoxazole-trimethoprim], Nsaids (non-steroidal anti-inflammatory drug), and Tramadol   Current Outpatient Medications   Medication Sig    ALPRAZolam (XANAX) 1 mg Oral Tablet Take 1 Tablet (1 mg total) by mouth Every 8 hours as needed for Anxiety for up to 120 days    atenoloL (TENORMIN) 50 mg Oral Tablet Take 1 Tablet (50 mg total) by mouth Daily    cranberry fruit (CRANBERRY) 450 mg Oral Tablet Take 2 Tablets (900 mg  total) by mouth Three times daily with meals (Patient taking differently: Take 2 Tablets (900 mg total) by mouth Every morning with breakfast)    esomeprazole magnesium (NEXIUM) 40 mg Oral Capsule, Delayed Release(E.C.) Take 1 Capsule (40 mg total) by mouth Once a day (Patient not taking: Reported on 05/18/2023)    fluticasone propionate (FLONASE) 50 mcg/actuation Nasal Spray, Suspension Administer 2 Sprays into each nostril Once per day as needed for Other (Nasal congestion)    imipramine (TOFRANIL) 50 mg Oral Tablet Take 1 Tablet (50 mg total) by mouth Every night    Lactobac 40-Bifido 3-S.thermop 100 billion cell Oral Capsule Take 1 Capsule by mouth Once a day    magnesium oxide (MAG-OX) 400 mg Oral Tablet Take 1 Tablet (400 mg total) by mouth Every night    pantoprazole (PROTONIX) 40 mg Oral Tablet, Delayed Release (E.C.) Take 1 Tablet (40 mg total) by mouth Daily      Objective :  BP 120/77 (Site: Left Arm, Patient Position: Sitting)   Pulse 90   Resp 18   Ht 1.549 m (5\' 1" )   Wt 59.4 kg (131 lb)   BMI 24.75 kg/m       PHQ Total Score  PHQ 2 Total: 0  PHQ 9 Total: 7  Interpretation of Total Score: 5-9 Mild depression             08/18/2023    10:32 AM 09/12/2023    10:09 AM 09/19/2023     9:42 AM   Most Recent PHQ-9 Scores   PHQ 9 Total 4 6 7         Mental Status Exam  AXOX4. Casual dress, calm, well-groomed. No SI, HI, AVH, delusions, or paranoia. Thoughts are logical, coherent, and goal-directed. Good eye contact. Speech is normal in rate and tone. Mood is "discouraged, but okay" affect congruent. No psychomotor agitation or retardation, cogwheel rigidity, or abnormal movements. Gait is normal. Attention, concentration, and memory are good. No cognitive deficits noted. Judgment and insight are fair. Calculation and abstraction are within normal limits.     Data Reviewed  I have reviewed patient's previous note medical, surgical, family, and social history in detail today,     Assessment  Major Depression,  Generalized Anxiety, Panic Attacks, and Insomnia  Plan  Continue current medications:  Xanax 1 mg tid/prn for anxiety.  Decrease Imipramine, as per patient, experiencing urinary retention and rnnny nose.  Re-start Amitriptyline 100 mg qhs for mood/sleep. Patient has enough of the 50 mg to last for one week.    Follow up  Return to clinic in 1 week.    Dawayne Cirri, PA-C

## 2023-09-26 ENCOUNTER — Ambulatory Visit: Admitting: PHYSICIAN ASSISTANT

## 2023-09-27 ENCOUNTER — Ambulatory Visit: Attending: PHYSICIAN ASSISTANT | Admitting: PHYSICIAN ASSISTANT

## 2023-09-27 ENCOUNTER — Encounter (HOSPITAL_PSYCHIATRIC): Payer: Self-pay | Admitting: PHYSICIAN ASSISTANT

## 2023-09-27 ENCOUNTER — Other Ambulatory Visit: Payer: Self-pay

## 2023-09-27 VITALS — BP 121/72 | HR 92 | Resp 17 | Ht 61.0 in | Wt 130.0 lb

## 2023-09-27 DIAGNOSIS — F410 Panic disorder [episodic paroxysmal anxiety] without agoraphobia: Secondary | ICD-10-CM | POA: Insufficient documentation

## 2023-09-27 DIAGNOSIS — Z818 Family history of other mental and behavioral disorders: Secondary | ICD-10-CM

## 2023-09-27 DIAGNOSIS — F329 Major depressive disorder, single episode, unspecified: Secondary | ICD-10-CM | POA: Insufficient documentation

## 2023-09-27 DIAGNOSIS — F411 Generalized anxiety disorder: Secondary | ICD-10-CM | POA: Insufficient documentation

## 2023-09-27 DIAGNOSIS — F5104 Psychophysiologic insomnia: Secondary | ICD-10-CM | POA: Insufficient documentation

## 2023-09-27 DIAGNOSIS — Z87891 Personal history of nicotine dependence: Secondary | ICD-10-CM

## 2023-09-27 DIAGNOSIS — G4700 Insomnia, unspecified: Secondary | ICD-10-CM

## 2023-09-27 MED ORDER — AMITRIPTYLINE 100 MG TABLET
100.0000 mg | ORAL_TABLET | Freq: Every evening | ORAL | 3 refills | Status: DC
Start: 2023-09-27 — End: 2023-10-17

## 2023-09-27 MED ORDER — ALPRAZOLAM 1 MG TABLET
1.0000 mg | ORAL_TABLET | Freq: Three times a day (TID) | ORAL | 3 refills | Status: DC | PRN
Start: 2023-09-27 — End: 2023-11-02

## 2023-09-27 NOTE — Progress Notes (Signed)
 BEHAVIORAL MEDICINE[facility] [place]  [address]  [place] [address]  Operated by [facility]  Progress Note    Name: Peggy Pugh MRN:  [id number]   Date: 09/27/2023 DOB:  1955/08/23 (68 y.o.)           Chief Complaint: MDD, GAD, Panic Attacks, and Insomnia.    Subjective:   Patient reports that Monday she did sleep, Tuesday night she did not, Wednesday it felt like she was sleeping, but she wasn't. Thursday, she doesn't remember, and Friday she slept. Saturday she went baby shopping all day. Sunday she became very drowsy and then yesterday she was very drowsy, so what she did last night was broke the Xanax  in half and did better. She has a baby shower to attend on April 5th.   Mood: "good".  Medications: Working better with no ill effects.   Appetite: She is eating well with a good appetite.  Sleep: Overall, better. Recommended a sleep study.  Energy: Really good on Saturday, not as much on the rest of the days.   Stressors: None.    Patient Active Problem List   Diagnosis    GAD (generalized anxiety disorder)    Panic attacks    Major depressive disorder    Psychophysiological insomnia     Past Medical History:   Diagnosis Date    Acid reflux     Generalized anxiety disorder     Irregular heart rate     Panic attacks 10/19/2021     Past Surgical History:   Procedure Laterality Date    HX CARPAL TUNNEL RELEASE      HX ROTATOR CUFF REPAIR       Family History   Problem Relation Name Age of Onset    Hypertension (High Blood Pressure) Mother      Anxiety Mother      Diabetes Brother       Social History     Socioeconomic History    Marital status: Married   Tobacco Use    Smoking status: Former     Types: Cigarettes     Passive exposure: Never    Smokeless tobacco: Never   Vaping Use    Vaping status: Never Used   Substance and Sexual Activity    Alcohol use: Never    Drug use: Never     Social Determinants of Health     Financial Resource Strain: Low  Risk  (10/18/2022)    Received from [facility], [facility]    Overall Financial Resource Strain (CARDIA)     Difficulty of Paying Living Expenses: Not very hard   Transportation Needs: No Transportation Needs (10/18/2022)    Received from [facility], [facility]    PRAPARE - Transportation     Lack of Transportation (Medical): No     Lack of Transportation (Non-Medical): No   Social Connections: Socially Integrated (10/18/2022)    Received from [facility], [facility]    Social Network     How would you rate your social network (family, work, friends)?: Good participation with social networks   Intimate Partner Violence: Not At Risk (10/18/2022)    Received from [facility], [facility]    HITS     Over the last 12 months how often did your partner physically hurt you?: Never     Over the last 12 months how often did your partner insult you or talk down to you?: Never  Over the last 12 months how often did your partner threaten you with physical harm?: Never     Over the last 12 months how often did your partner scream or curse at you?: Never   Housing Stability: Low Risk  (10/18/2022)    Received from [facility], [facility]    Housing Stability Vital Sign     Unable to Pay for Housing in the Last Year: No     Number of Places Lived in the Last Year: 1     Unstable Housing in the Last Year: No      5-hydroxytryptophan, Atorvastatin, Clindamycin, Robaxin  [methocarbamol ], Bactrim [sulfamethoxazole-trimethoprim], Nsaids (non-steroidal anti-inflammatory drug), and Tramadol   Current Outpatient Medications   Medication Sig    ALPRAZolam  (XANAX ) 1 mg Oral Tablet Take 1 Tablet (1 mg total) by mouth Every 8 hours as needed for Anxiety for up to 120 days    atenoloL (TENORMIN) 50 mg Oral Tablet Take 1 Tablet (50 mg total) by mouth Daily    cranberry fruit (CRANBERRY) 450 mg Oral Tablet Take 2 Tablets (900 mg total) by mouth Three times daily with meals    esomeprazole magnesium (NEXIUM) 40 mg Oral  Capsule, Delayed Release(E.C.) Take 1 Capsule (40 mg total) by mouth Daily    fluticasone propionate (FLONASE) 50 mcg/actuation Nasal Spray, Suspension Administer 2 Sprays into each nostril Once per day as needed for Other (Nasal congestion) (Patient not taking: Reported on 09/27/2023)    Lactobac 40-Bifido 3-S.thermop 100 billion cell Oral Capsule Take 1 Capsule by mouth Once a day    magnesium oxide (MAG-OX) 400 mg Oral Tablet Take 1 Tablet (400 mg total) by mouth Every night    pantoprazole (PROTONIX) 40 mg Oral Tablet, Delayed Release (E.C.) Take 1 Tablet (40 mg total) by mouth Daily      Objective :  BP 121/72   Pulse 92   Resp 17   Ht 1.549 m (5\' 1" )   Wt 59 kg (130 lb)   BMI 24.56 kg/m       PHQ Total Score  PHQ 2 Total: 0  PHQ 9 Total: 4  Interpretation of Total Score: 0-4 No depression             09/12/2023    10:09 AM 09/19/2023     9:42 AM 09/27/2023     9:28 AM   Most Recent PHQ-9 Scores   PHQ 9 Total 6 7 4         Mental Status Exam  AXOX4. Casual dress, calm, well-groomed. No SI, HI, AVH, delusions, or paranoia. Thoughts are logical, coherent, and goal-directed. Good eye contact. Speech is normal in rate and tone. Mood is "okay" affect congruent. No psychomotor agitation or retardation, cogwheel rigidity, or abnormal movements. Gait is normal. Attention, concentration, and memory are good. No cognitive deficits noted. Judgment and insight are fair. Calculation and abstraction are within normal limits.     Data Reviewed  I have reviewed patient's previous note medical, surgical, family, and social history in detail today,     Assessment  MDD, GAD, Panic Attacks, and Insomnia.    Plan  Continue current medications:  Xanax  1 mg tid/prn for anxiety.  Amitriptyline  100 mg qhs for mood/sleep.        Follow up  Return to clinic in 1 weeks.    Alline Ivans, PA-C

## 2023-10-04 ENCOUNTER — Encounter (HOSPITAL_PSYCHIATRIC): Payer: Self-pay | Admitting: PHYSICIAN ASSISTANT

## 2023-10-04 ENCOUNTER — Ambulatory Visit: Payer: Self-pay | Attending: PHYSICIAN ASSISTANT | Admitting: PHYSICIAN ASSISTANT

## 2023-10-04 ENCOUNTER — Other Ambulatory Visit: Payer: Self-pay

## 2023-10-04 VITALS — BP 147/70 | HR 84 | Resp 18 | Ht 60.0 in | Wt 130.0 lb

## 2023-10-04 DIAGNOSIS — Z87891 Personal history of nicotine dependence: Secondary | ICD-10-CM

## 2023-10-04 DIAGNOSIS — Z818 Family history of other mental and behavioral disorders: Secondary | ICD-10-CM

## 2023-10-04 DIAGNOSIS — F329 Major depressive disorder, single episode, unspecified: Secondary | ICD-10-CM | POA: Insufficient documentation

## 2023-10-04 DIAGNOSIS — G4700 Insomnia, unspecified: Secondary | ICD-10-CM

## 2023-10-04 DIAGNOSIS — F5104 Psychophysiologic insomnia: Secondary | ICD-10-CM | POA: Insufficient documentation

## 2023-10-04 DIAGNOSIS — F411 Generalized anxiety disorder: Secondary | ICD-10-CM | POA: Insufficient documentation

## 2023-10-04 DIAGNOSIS — F410 Panic disorder [episodic paroxysmal anxiety] without agoraphobia: Secondary | ICD-10-CM | POA: Insufficient documentation

## 2023-10-04 NOTE — Progress Notes (Signed)
 BEHAVIORAL MEDICINE[facility] [place]  [address]  [place] [address]  Operated by [facility]  Progress Note    Name: Peggy Pugh MRN:  [id number]   Date: 10/04/2023 DOB:  January 04, 1956 (67 y.o.)           Chief Complaint: Panic Attacks, MDD, GAD, and Insomnia.    Subjective:   Patient reports that she has been sleeping like a log. At 9:30 pm she really has to fight it. She feels that it is because it hasn't got into her system. She is packed and ready [place] go to the beach for the season. She has to drive her son's car down, so she doesn't want to be sleepy or groggy. Her son likes to be at the beach for his birthday on the 17th. She is excited for Easter and the services at her church down there.   Mood "good".   Medications: Working with some daytime grogginess, but she feels that she will adjust to it over time.   Appetite: She is eating better, but she is watching her sugar and her carb intake. She gained more weight when they increased the Amitriptyline  last summer.   Sleep: She is sleeping better with the increased Amitriptyline  dose. Her husband has been complaining that she is snoring loudly.   Energy: Doing fairly well.   Stressors: None.    Patient Active Problem List   Diagnosis    GAD (generalized anxiety disorder)    Panic attacks    Major depressive disorder    Psychophysiological insomnia     Past Medical History:   Diagnosis Date    Acid reflux     Generalized anxiety disorder     Irregular heart rate     Panic attacks 10/19/2021     Past Surgical History:   Procedure Laterality Date    HX CARPAL TUNNEL RELEASE      HX ROTATOR CUFF REPAIR       Family History   Problem Relation Name Age of Onset    Hypertension (High Blood Pressure) Mother      Anxiety Mother      Diabetes Brother       Social History     Socioeconomic History    Marital status: Married   Tobacco Use    Smoking status: Former     Types: Cigarettes     Passive exposure: Never     Smokeless tobacco: Never   Vaping Use    Vaping status: Never Used   Substance and Sexual Activity    Alcohol use: Never    Drug use: Never     Social Determinants of Health     Financial Resource Strain: Low Risk  (10/18/2022)    Received from [facility], [facility]    Overall Financial Resource Strain (CARDIA)     Difficulty of Paying Living Expenses: Not very hard   Transportation Needs: No Transportation Needs (10/18/2022)    Received from [facility], [facility]    PRAPARE - Transportation     Lack of Transportation (Medical): No     Lack of Transportation (Non-Medical): No   Social Connections: Socially Integrated (10/18/2022)    Received from [facility], [facility]    Social Network     How would you rate your social network (family, work, friends)?: Good participation with social networks   Intimate Partner Violence: Not At Risk (10/18/2022)    Received from [facility],  [facility]    HITS     Over the last 12 months how often did your partner physically hurt you?: Never     Over the last 12 months how often did your partner insult you or talk down to you?: Never     Over the last 12 months how often did your partner threaten you with physical harm?: Never     Over the last 12 months how often did your partner scream or curse at you?: Never   Housing Stability: Low Risk  (10/18/2022)    Received from [facility], [facility]    Housing Stability Vital Sign     Unable to Pay for Housing in the Last Year: No     Number of Places Lived in the Last Year: 1     Unstable Housing in the Last Year: No      5-hydroxytryptophan, Atorvastatin, Clindamycin, Robaxin  [methocarbamol ], Bactrim [sulfamethoxazole-trimethoprim], Nsaids (non-steroidal anti-inflammatory drug), and Tramadol   Current Outpatient Medications   Medication Sig    ALPRAZolam  (XANAX ) 1 mg Oral Tablet Take 1 Tablet (1 mg total) by mouth Every 8 hours as needed for Anxiety for up to 120 days    amitriptyline  (ELAVIL ) 100 mg Oral  Tablet Take 1 Tablet (100 mg total) by mouth Every night for 120 days Indications: anxious, depression    atenoloL (TENORMIN) 50 mg Oral Tablet Take 1 Tablet (50 mg total) by mouth Daily    cranberry fruit (CRANBERRY) 450 mg Oral Tablet Take 2 Tablets (900 mg total) by mouth Three times daily with meals    esomeprazole magnesium (NEXIUM) 40 mg Oral Capsule, Delayed Release(E.C.) Take 1 Capsule (40 mg total) by mouth Daily    fluticasone propionate (FLONASE) 50 mcg/actuation Nasal Spray, Suspension Administer 2 Sprays into each nostril Once per day as needed for Other (Nasal congestion)    Lactobac 40-Bifido 3-S.thermop 100 billion cell Oral Capsule Take 1 Capsule by mouth Once a day    magnesium oxide (MAG-OX) 400 mg Oral Tablet Take 1 Tablet (400 mg total) by mouth Every night    pantoprazole (PROTONIX) 40 mg Oral Tablet, Delayed Release (E.C.) Take 1 Tablet (40 mg total) by mouth Daily      Objective :  BP (!) 147/70 (Site: Left Arm, Patient Position: Sitting)   Pulse 84   Resp 18   Ht 1.524 m (5')   Wt 59 kg (130 lb)   BMI 25.39 kg/m       PHQ Total Score  PHQ 2 Total: 0  PHQ 9 Total: 1  Interpretation of Total Score: 0-4 No depression             09/19/2023     9:42 AM 09/27/2023     9:28 AM 10/04/2023    10:04 AM   Most Recent PHQ-9 Scores   PHQ 9 Total 7 4 1         Mental Status Exam  AXOX4. Casual dress, calm, well-groomed. No SI, HI, AVH, delusions, or paranoia. Thoughts are logical, coherent, and goal-directed. Good eye contact. Speech is normal in rate and tone. Mood is "okay" affect congruent. No psychomotor agitation or retardation, cogwheel rigidity, or abnormal movements. Gait is normal. Attention, concentration, and memory are good. No cognitive deficits noted. Judgment and insight are fair. Calculation and abstraction are within normal limits.     Data Reviewed  I have reviewed patient's previous note medical, surgical, family, and social history in detail today,     Assessment  Panic Attacks, MDD,  GAD, and Insomnia.    Plan  Continue current medications:  Xanax  1 mg tid/prn for anxiety.  Amitriptyline  100 mg qhs for mood/sleep.      Follow up  Return to clinic in 1 week at appointment already scheduled.    Alline Ivans, PA-C

## 2023-10-10 ENCOUNTER — Other Ambulatory Visit: Payer: Self-pay

## 2023-10-10 ENCOUNTER — Encounter (HOSPITAL_PSYCHIATRIC): Payer: Self-pay | Admitting: PHYSICIAN ASSISTANT

## 2023-10-10 ENCOUNTER — Ambulatory Visit: Payer: Self-pay | Attending: PHYSICIAN ASSISTANT | Admitting: PHYSICIAN ASSISTANT

## 2023-10-10 VITALS — BP 107/67 | HR 91 | Resp 18 | Ht 61.0 in | Wt 131.0 lb

## 2023-10-10 DIAGNOSIS — F329 Major depressive disorder, single episode, unspecified: Secondary | ICD-10-CM | POA: Insufficient documentation

## 2023-10-10 DIAGNOSIS — F5104 Psychophysiologic insomnia: Secondary | ICD-10-CM | POA: Insufficient documentation

## 2023-10-10 DIAGNOSIS — F410 Panic disorder [episodic paroxysmal anxiety] without agoraphobia: Secondary | ICD-10-CM | POA: Insufficient documentation

## 2023-10-10 DIAGNOSIS — F411 Generalized anxiety disorder: Secondary | ICD-10-CM | POA: Insufficient documentation

## 2023-10-10 DIAGNOSIS — G4700 Insomnia, unspecified: Secondary | ICD-10-CM

## 2023-10-10 DIAGNOSIS — Z818 Family history of other mental and behavioral disorders: Secondary | ICD-10-CM

## 2023-10-10 NOTE — Progress Notes (Signed)
 BEHAVIORAL [facility]ICINE[facility] [place]  [address]  [place] [address]  Operated by [facility]  Progress Note    Name: Peggy Pugh MRN:  [id number]   Date: 10/10/2023 DOB:  02-10-1956 (67 y.o.)           Chief Complaint: Generalized Anxiety, Panic Attacks, Insomnia, and Major Depression    Subjective:   Patient reports that she is still struggling with the higher dose of Elavil. She is hoping that her body will get used to the medication, because past changes in medication and dosages have not been successful. She has not had any pain since the injection and increase in Amitriptyline. She is putting off her beach trip, because she has to drive her son's car down and is worried about driving on the medication until she is used to the higher dose. They are going down on the 14th just to take some things and get set up.  Mood: "nervous".  Medications: She feels they are beneficial and is hoping that the daytime somnolence eases.   Appetite: She is eating pretty good and is watching the scales due to previous weight gain with Amitriptyline.  Sleep: She is sleeping during the daytime, but is sleeping at bedtime. The jitteriness in the morning has worn off. She is able to take a small nap during the day.   Energy: No difference in her levels or motivation.  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     Patient Active Problem List   Diagnosis    GAD (generalized anxiety disorder)    Panic attacks    Major depressive disorder    Psychophysiological insomnia     Past Medical History:   Diagnosis Date    Acid reflux     Generalized anxiety disorder     Irregular heart rate     Panic attacks 10/19/2021     Past Surgical History:   Procedure Laterality Date    HX CARPAL TUNNEL RELEASE      HX ROTATOR CUFF REPAIR       Family History   Problem Relation Name Age of Onset    Hypertension (High Blood Pressure) Mother      Anxiety Mother      Diabetes Brother       Social History      Socioeconomic History    Marital status: Married   Tobacco Use    Smoking status: Former     Types: Cigarettes     Passive exposure: Never    Smokeless tobacco: Never   Vaping Use    Vaping status: Never Used   Substance and Sexual Activity    Alcohol use: Never    Drug use: Never     Social Determinants of Health     Financial Resource Strain: Low Risk  (10/18/2022)    Received from [facility], [facility]    Overall Financial Resource Strain (CARDIA)     Difficulty of Paying Living Expenses: Not very hard   Transportation Needs: No Transportation Needs (10/18/2022)    Received from [facility], [facility]    PRAPARE - Transportation     Lack of Transportation (Medical): No     Lack of Transportation (Non-Medical): No   Social Connections: Socially Integrated (10/18/2022)    Received from [facility], [facility]    Social Network     How would you rate your social network (family, work, friends)?: Good participation with social networks  Intimate Partner Violence: Not At Risk (10/18/2022)    Received from [facility], [facility]    HITS     Over the last 12 months how often did your partner physically hurt you?: Never     Over the last 12 months how often did your partner insult you or talk down to you?: Never     Over the last 12 months how often did your partner threaten you with physical harm?: Never     Over the last 12 months how often did your partner scream or curse at you?: Never   Housing Stability: Low Risk  (10/18/2022)    Received from [facility], [facility]    Housing Stability Vital Sign     Unable to Pay for Housing in the Last Year: No     Number of Places Lived in the Last Year: 1     Unstable Housing in the Last Year: No      5-hydroxytryptophan, Atorvastatin, Clindamycin, Robaxin [methocarbamol], Bactrim [sulfamethoxazole-trimethoprim], Nsaids (non-steroidal anti-inflammatory drug), and Tramadol   Current Outpatient Medications   Medication Sig    ALPRAZolam (XANAX) 1 mg  Oral Tablet Take 1 Tablet (1 mg total) by mouth Every 8 hours as needed for Anxiety for up to 120 days    amitriptyline (ELAVIL) 100 mg Oral Tablet Take 1 Tablet (100 mg total) by mouth Every night for 120 days Indications: anxious, depression    atenoloL (TENORMIN) 50 mg Oral Tablet Take 1 Tablet (50 mg total) by mouth Daily    cranberry fruit (CRANBERRY) 450 mg Oral Tablet Take 2 Tablets (900 mg total) by mouth Three times daily with meals    esomeprazole magnesium (NEXIUM) 40 mg Oral Capsule, Delayed Release(E.C.) Take 1 Capsule (40 mg total) by mouth Daily (Patient not taking: Reported on 10/10/2023)    fluticasone propionate (FLONASE) 50 mcg/actuation Nasal Spray, Suspension Administer 2 Sprays into each nostril Once per day as needed for Other (Nasal congestion)    Lactobac 40-Bifido 3-S.thermop 100 billion cell Oral Capsule Take 1 Capsule by mouth Once a day    magnesium oxide (MAG-OX) 400 mg Oral Tablet Take 1 Tablet (400 mg total) by mouth Every night    pantoprazole (PROTONIX) 40 mg Oral Tablet, Delayed Release (E.C.) Take 1 Tablet (40 mg total) by mouth Daily      Objective :  BP 107/67 (Patient Position: Sitting)   Pulse 91   Resp 18   Ht 1.549 m (5\' 1" )   Wt 59.4 kg (131 lb)   BMI 24.75 kg/m       PHQ Total Score  PHQ 2 Total: 1  PHQ 9 Total: 4  Interpretation of Total Score: 0-4 No depression             09/27/2023     9:28 AM 10/04/2023    10:04 AM 10/10/2023     9:44 AM   Most Recent PHQ-9 Scores   PHQ 9 Total 4 1 4         Mental Status Exam  AXOX4. Casual dress, calm, well-groomed. No SI, HI, AVH, delusions, or paranoia. Thoughts are logical, coherent, and goal-directed. Good eye contact. Speech is normal in rate and tone. Mood is "okay" affect congruent. No psychomotor agitation or retardation, cogwheel rigidity, or abnormal movements. Gait is normal. Attention, concentration, and memory are good. No cognitive deficits noted. Judgment and insight are fair. Calculation and abstraction are within  normal limits.     Data Reviewed  I have  reviewed patient's previous note medical, surgical, family, and social history in detail today,     Assessment  Generalized Anxiety, Panic Attacks, Insomnia, and Major Depression    Plan  Continue current medications:  Xanax 1 mg tid/prn for anxiety.  Amitriptyline 100 mg qhs for mood/sleep.         Follow up  Return to clinic on April 23rd.    Dawayne Cirri, PA-C

## 2023-10-17 ENCOUNTER — Other Ambulatory Visit: Payer: Self-pay

## 2023-10-17 ENCOUNTER — Ambulatory Visit: Attending: PHYSICIAN ASSISTANT | Admitting: PHYSICIAN ASSISTANT

## 2023-10-17 ENCOUNTER — Encounter (HOSPITAL_PSYCHIATRIC): Payer: Self-pay | Admitting: PHYSICIAN ASSISTANT

## 2023-10-17 VITALS — BP 129/72 | HR 97 | Resp 18 | Ht 61.0 in | Wt 131.0 lb

## 2023-10-17 DIAGNOSIS — F410 Panic disorder [episodic paroxysmal anxiety] without agoraphobia: Secondary | ICD-10-CM | POA: Insufficient documentation

## 2023-10-17 DIAGNOSIS — G4700 Insomnia, unspecified: Secondary | ICD-10-CM

## 2023-10-17 DIAGNOSIS — F411 Generalized anxiety disorder: Secondary | ICD-10-CM | POA: Insufficient documentation

## 2023-10-17 DIAGNOSIS — F5104 Psychophysiologic insomnia: Secondary | ICD-10-CM | POA: Insufficient documentation

## 2023-10-17 DIAGNOSIS — F329 Major depressive disorder, single episode, unspecified: Secondary | ICD-10-CM | POA: Insufficient documentation

## 2023-10-17 MED ORDER — MIRTAZAPINE 7.5 MG TABLET
7.5000 mg | ORAL_TABLET | Freq: Every evening | ORAL | 0 refills | Status: DC
Start: 2023-10-17 — End: 2023-10-26

## 2023-10-17 NOTE — Patient Instructions (Signed)
 Take medications as prescribed, avoid drugs and alcohol, call office if symptoms worsen or problems arise.  [phone].

## 2023-10-17 NOTE — Progress Notes (Signed)
 BEHAVIORAL MEDICINE[facility] [place]  [address]  [place] [address]  Operated by [facility]  Progress Note    Name: Peggy Pugh MRN:  [id number]   Date: 10/17/2023 DOB:  [birth year] (68 y.o.)           Chief Complaint: Insomnia, Panic Attacks, MDD, and GAD.    Subjective:   Patient reports that Friday, Saturday, and Sunday she stopped sleeping. Up to that time she was doing well with no issues. There were no changes over the weekend. She went to a friend's shower. She and her husband got out and did a little shopping.   Mood: "disappointed".  Medications: Worked for two weeks, then stopped. She was getting over the day-time drowsiness. She was taking a 1/2 Xanax and a full Amitriptyline. She takes one Xanax in the morning, one in the evening and a half tablet at night. She has tried to do just the Xanax, Imipramine, Pamelor, Trazodone, Melatonin, Ambien, and Amitriptyline. She has been on the Amitriptyline for the past twenty years.   Appetite: Decreases with lack of sleep.   Sleep: None for the past three days.   Energy: Decreases with lack of sleep. Motivation is there, but unable to follow through.   Stressors: Nothing new. They are packed and ready to go to the beach.     Patient Active Problem List   Diagnosis    GAD (generalized anxiety disorder)    Panic attacks    Major depressive disorder    Psychophysiological insomnia     Past Medical History:   Diagnosis Date    Acid reflux     Generalized anxiety disorder     Irregular heart rate     Panic attacks 10/19/2021     Past Surgical History:   Procedure Laterality Date    HX CARPAL TUNNEL RELEASE      HX ROTATOR CUFF REPAIR       Family History   Problem Relation Name Age of Onset    Hypertension (High Blood Pressure) Mother      Anxiety Mother      Diabetes Brother       Social History     Socioeconomic History    Marital status: Married   Tobacco Use    Smoking status: Former     Types:  Cigarettes     Passive exposure: Never    Smokeless tobacco: Never   Vaping Use    Vaping status: Never Used   Substance and Sexual Activity    Alcohol use: Never    Drug use: Never     Social Determinants of Health     Financial Resource Strain: Low Risk  (10/18/2022)    Received from [facility], [facility]    Overall Financial Resource Strain (CARDIA)     Difficulty of Paying Living Expenses: Not very hard   Transportation Needs: No Transportation Needs (10/18/2022)    Received from [facility], [facility]    PRAPARE - Transportation     Lack of Transportation (Medical): No     Lack of Transportation (Non-Medical): No   Social Connections: Socially Integrated (10/18/2022)    Received from [facility], [facility]    Social Network     How would you rate your social network (family, work, friends)?: Good participation with social networks   Intimate Partner Violence: Not At Risk (10/18/2022)    Received from [facility], [facility]  HITS     Over the last 12 months how often did your partner physically hurt you?: Never     Over the last 12 months how often did your partner insult you or talk down to you?: Never     Over the last 12 months how often did your partner threaten you with physical harm?: Never     Over the last 12 months how often did your partner scream or curse at you?: Never   Housing Stability: Low Risk  (10/18/2022)    Received from [facility], [facility]    Housing Stability Vital Sign     Unable to Pay for Housing in the Last Year: No     Number of Places Lived in the Last Year: 1     Unstable Housing in the Last Year: No      5-hydroxytryptophan, Atorvastatin, Clindamycin, Robaxin [methocarbamol], Bactrim [sulfamethoxazole-trimethoprim], Nsaids (non-steroidal anti-inflammatory drug), and Tramadol   Current Outpatient Medications   Medication Sig    ALPRAZolam (XANAX) 1 mg Oral Tablet Take 1 Tablet (1 mg total) by mouth Every 8 hours as needed for Anxiety for up to 120 days     amitriptyline (ELAVIL) 100 mg Oral Tablet Take 1 Tablet (100 mg total) by mouth Every night for 120 days Indications: anxious, depression    atenoloL (TENORMIN) 50 mg Oral Tablet Take 1 Tablet (50 mg total) by mouth Daily    cranberry fruit (CRANBERRY) 450 mg Oral Tablet Take 2 Tablets (900 mg total) by mouth Three times daily with meals    esomeprazole magnesium (NEXIUM) 40 mg Oral Capsule, Delayed Release(E.C.) Take 1 Capsule (40 mg total) by mouth Daily    fluticasone propionate (FLONASE) 50 mcg/actuation Nasal Spray, Suspension Administer 2 Sprays into each nostril Once per day as needed for Other (Nasal congestion)    Lactobac 40-Bifido 3-S.thermop 100 billion cell Oral Capsule Take 1 Capsule by mouth Once a day    magnesium oxide (MAG-OX) 400 mg Oral Tablet Take 1 Tablet (400 mg total) by mouth Every night    pantoprazole (PROTONIX) 40 mg Oral Tablet, Delayed Release (E.C.) Take 1 Tablet (40 mg total) by mouth Daily      Objective :  BP 129/72   Pulse 97   Resp 18   Ht 1.549 m (5\' 1" )   Wt 59.4 kg (131 lb)   BMI 24.75 kg/m       PHQ Total Score  PHQ 2 Total: 0  PHQ 9 Total: 6  Interpretation of Total Score: 5-9 Mild depression             10/04/2023    10:04 AM 10/10/2023     9:44 AM 10/17/2023     9:29 AM   Most Recent PHQ-9 Scores   PHQ 9 Total 1 4 6         Mental Status Exam  AXOX4. Casual dress, calm, well-groomed. No SI, HI, AVH, delusions, or paranoia. Thoughts are logical, coherent, and goal-directed. Good eye contact. Speech is normal in rate and tone. Mood is "okay" affect congruent. No psychomotor agitation or retardation, cogwheel rigidity, or abnormal movements. Gait is normal. Attention, concentration, and memory are good. No cognitive deficits noted. Judgment and insight are fair. Calculation and abstraction are within normal limits.     Data Reviewed  I have reviewed patient's previous note medical, surgical, family, and social history in detail today,     Assessment  Insomnia, Panic  Attacks, MDD, and GAD.  Plan  Continue current medications:  Xanax 1 mg tid/prn for anxiety.  Discontinue Amitriptyline, she has stopped it in the past and noted no withdrawals.   Start Remeron 7.5 mg qhs for mood/sleep.       Follow up  Return to clinic on 10/26/2023 as previously scheduled.    Alline Ivans, PA-C

## 2023-10-19 ENCOUNTER — Encounter (HOSPITAL_PSYCHIATRIC): Payer: Self-pay | Admitting: PHYSICIAN ASSISTANT

## 2023-10-19 NOTE — Telephone Encounter (Signed)
 Patient called and is having trouble falling asleep with the Remeron 7.5mg . Patient asking if she can try taking 2 mg them to increase it to 15 mg. Happy to call patient back with any advice/orders you may have. Thank You

## 2023-10-20 ENCOUNTER — Encounter (HOSPITAL_PSYCHIATRIC): Payer: Self-pay | Admitting: PHYSICIAN ASSISTANT

## 2023-10-20 NOTE — Telephone Encounter (Signed)
 Patient left a voice mail Saying the 2 Remeron didn't help her sleep either. Wanted to know what else there was to try, Her number is     [phone]    Or i am happy to call patient back with any advice/orders you may have. Thanks!

## 2023-10-24 ENCOUNTER — Telehealth (HOSPITAL_PSYCHIATRIC): Payer: Self-pay | Admitting: PHYSICIAN ASSISTANT

## 2023-10-24 NOTE — Telephone Encounter (Signed)
 Patient called stating that she started taking 4 of the Remeron  (30 mg) and on Saturday she slept all night. Sunday night was off and on. Did not want to go back to Amitriptyline  because she thinks this is helping with her anxiety too. Does question if she should increase one more time.

## 2023-10-26 ENCOUNTER — Ambulatory Visit: Payer: Self-pay | Attending: PHYSICIAN ASSISTANT | Admitting: PHYSICIAN ASSISTANT

## 2023-10-26 ENCOUNTER — Other Ambulatory Visit: Payer: Self-pay

## 2023-10-26 ENCOUNTER — Encounter (HOSPITAL_PSYCHIATRIC): Payer: Self-pay | Admitting: PHYSICIAN ASSISTANT

## 2023-10-26 VITALS — BP 128/52 | HR 85 | Resp 18 | Ht 61.0 in | Wt 128.0 lb

## 2023-10-26 DIAGNOSIS — F5104 Psychophysiologic insomnia: Secondary | ICD-10-CM | POA: Insufficient documentation

## 2023-10-26 DIAGNOSIS — G4700 Insomnia, unspecified: Secondary | ICD-10-CM

## 2023-10-26 DIAGNOSIS — F410 Panic disorder [episodic paroxysmal anxiety] without agoraphobia: Secondary | ICD-10-CM | POA: Insufficient documentation

## 2023-10-26 DIAGNOSIS — F329 Major depressive disorder, single episode, unspecified: Secondary | ICD-10-CM | POA: Insufficient documentation

## 2023-10-26 DIAGNOSIS — F411 Generalized anxiety disorder: Secondary | ICD-10-CM | POA: Insufficient documentation

## 2023-10-26 NOTE — Progress Notes (Signed)
 BEHAVIORAL MEDICINE[facility] [place]  [address]  [place] [address]  Operated by [facility]  Progress Note    Name: Peggy Pugh MRN:  [id number]   Date: 10/26/2023 DOB:  09/29/1955 (68 y.o.)           Chief Complaint: Generalized Anxiety, Panic Disorder, Major Depression, and Insomnia    Subjective:   Patient reports that the Remeron , she has been adjusting the Remeron  from one tablet up to four tablets, but she is having terrible dreams. She has been on Amitriptyline , Trazodone , Imipramine , and Remeron . She has looked back through the other trials and feels that the Trazodone  might do better. She remembers that it did make her feel better during the daytime. She reports that they have been back and forth since March.   Mood: "good".   Medications: She would like to re-try Trazodone , but at a higher dose. The Xanax  is still working well.   Appetite: Eating fairly well.   Sleep: Sleeping better but experiencing nightmares.   Energy: Doing fairly well.   Stressors: Lack of sleep.     Patient Active Problem List   Diagnosis    GAD (generalized anxiety disorder)    Panic attacks    Major depressive disorder    Psychophysiological insomnia     Past Medical History:   Diagnosis Date    Acid reflux     Generalized anxiety disorder     Irregular heart rate     Panic attacks 10/19/2021     Past Surgical History:   Procedure Laterality Date    HX CARPAL TUNNEL RELEASE      HX ROTATOR CUFF REPAIR       Family History   Problem Relation Name Age of Onset    Hypertension (High Blood Pressure) Mother      Anxiety Mother      Diabetes Brother       Social History     Socioeconomic History    Marital status: Married   Tobacco Use    Smoking status: Former     Types: Cigarettes     Passive exposure: Never    Smokeless tobacco: Never   Vaping Use    Vaping status: Never Used   Substance and Sexual Activity    Alcohol use: Never    Drug use: Never     Social  Determinants of Health     Financial Resource Strain: Low Risk  (10/18/2022)    Received from [facility], [facility]    Overall Financial Resource Strain (CARDIA)     Difficulty of Paying Living Expenses: Not very hard   Transportation Needs: No Transportation Needs (10/18/2022)    Received from [facility], [facility]    PRAPARE - Transportation     Lack of Transportation (Medical): No     Lack of Transportation (Non-Medical): No   Social Connections: Socially Integrated (10/18/2022)    Received from [facility], [facility]    Social Network     How would you rate your social network (family, work, friends)?: Good participation with social networks   Intimate Partner Violence: Not At Risk (10/18/2022)    Received from [facility], [facility]    HITS     Over the last 12 months how often did your partner physically hurt you?: Never     Over the last 12 months how often did your partner insult you or talk down to you?: Never  Over the last 12 months how often did your partner threaten you with physical harm?: Never     Over the last 12 months how often did your partner scream or curse at you?: Never   Housing Stability: Low Risk  (10/18/2022)    Received from [facility], [facility]    Housing Stability Vital Sign     Unable to Pay for Housing in the Last Year: No     Number of Places Lived in the Last Year: 1     Unstable Housing in the Last Year: No      5-hydroxytryptophan, Atorvastatin, Clindamycin, Robaxin  [methocarbamol ], Bactrim [sulfamethoxazole-trimethoprim], Nsaids (non-steroidal anti-inflammatory drug), and Tramadol   Current Outpatient Medications   Medication Sig    ALPRAZolam  (XANAX ) 1 mg Oral Tablet Take 1 Tablet (1 mg total) by mouth Every 8 hours as needed for Anxiety for up to 120 days    atenoloL (TENORMIN) 50 mg Oral Tablet Take 1 Tablet (50 mg total) by mouth Daily    cranberry fruit (CRANBERRY) 450 mg Oral Tablet Take 2 Tablets (900 mg total) by mouth Three times daily  with meals    fluticasone propionate (FLONASE) 50 mcg/actuation Nasal Spray, Suspension Administer 2 Sprays into each nostril Once per day as needed for Other (Nasal congestion)    Lactobac 40-Bifido 3-S.thermop 100 billion cell Oral Capsule Take 1 Capsule by mouth Once a day    magnesium oxide (MAG-OX) 400 mg Oral Tablet Take 1 Tablet (400 mg total) by mouth Every night    Mirtazapine  (REMERON ) 7.5 mg Oral Tablet Take 1 Tablet (7.5 mg total) by mouth Every night (Patient taking differently: Take 1 Tablet (7.5 mg total) by mouth Every night Pt can take up to 4 tabs)    pantoprazole (PROTONIX) 40 mg Oral Tablet, Delayed Release (E.C.) Take 1 Tablet (40 mg total) by mouth Daily      Objective :  BP (!) 128/52 (Site: Left Arm, Patient Position: Sitting)   Pulse 85   Resp 18   Ht 1.549 m (5\' 1" )   Wt 58.1 kg (128 lb)   BMI 24.19 kg/m       PHQ Total Score  PHQ 2 Total: 0  PHQ 9 Total: 3  Interpretation of Total Score: 0-4 No depression             10/10/2023     9:44 AM 10/17/2023     9:29 AM 10/26/2023    10:02 AM   Most Recent PHQ-9 Scores   PHQ 9 Total 4 6 3         Mental Status Exam  AXOX4. Casual dress, calm, well-groomed. No SI, HI, AVH, delusions, or paranoia. Thoughts are logical, coherent, and goal-directed. Good eye contact. Speech is normal in rate and tone. Mood is "okay" affect congruent. No psychomotor agitation or retardation, cogwheel rigidity, or abnormal movements. Gait is normal. Attention, concentration, and memory are good. No cognitive deficits noted. Judgment and insight are fair. Calculation and abstraction are within normal limits.     Data Reviewed  I have reviewed patient's previous note medical, surgical, family, and social history in detail today,     Assessment  Generalized Anxiety, Panic Disorder, Major Depression, and Insomnia    Plan  Discontinue Remeron , due to nightmares.   Re-start Trazodone  and increase from 50 mg to 100 mg qhs for mood/sleep.  Continue Xanax  1 mg tid/prn for  anxiety/panic attacks.     Follow up  Return to clinic in one  week.     Alline Ivans, PA-C

## 2023-11-02 ENCOUNTER — Encounter (HOSPITAL_PSYCHIATRIC): Payer: Self-pay | Admitting: PHYSICIAN ASSISTANT

## 2023-11-02 ENCOUNTER — Ambulatory Visit: Payer: Self-pay | Attending: PHYSICIAN ASSISTANT | Admitting: PHYSICIAN ASSISTANT

## 2023-11-02 ENCOUNTER — Other Ambulatory Visit: Payer: Self-pay

## 2023-11-02 VITALS — BP 120/80 | HR 68 | Resp 16 | Ht 60.0 in | Wt 128.0 lb

## 2023-11-02 DIAGNOSIS — F5104 Psychophysiologic insomnia: Secondary | ICD-10-CM | POA: Insufficient documentation

## 2023-11-02 DIAGNOSIS — F329 Major depressive disorder, single episode, unspecified: Secondary | ICD-10-CM | POA: Insufficient documentation

## 2023-11-02 DIAGNOSIS — F410 Panic disorder [episodic paroxysmal anxiety] without agoraphobia: Secondary | ICD-10-CM | POA: Insufficient documentation

## 2023-11-02 DIAGNOSIS — Z79899 Other long term (current) drug therapy: Secondary | ICD-10-CM

## 2023-11-02 DIAGNOSIS — F411 Generalized anxiety disorder: Secondary | ICD-10-CM | POA: Insufficient documentation

## 2023-11-02 DIAGNOSIS — G4700 Insomnia, unspecified: Secondary | ICD-10-CM

## 2023-11-02 MED ORDER — TRAZODONE 100 MG TABLET
100.0000 mg | ORAL_TABLET | Freq: Every evening | ORAL | 1 refills | Status: DC
Start: 2023-11-02 — End: 2023-11-08

## 2023-11-02 MED ORDER — ALPRAZOLAM 1 MG TABLET
1.0000 mg | ORAL_TABLET | Freq: Three times a day (TID) | ORAL | 3 refills | Status: DC | PRN
Start: 2023-11-02 — End: 2023-11-08

## 2023-11-02 NOTE — Patient Instructions (Signed)
 Take medications as prescribed, avoid drugs and alcohol, call office if symptoms worsen or problems arise.  [phone].

## 2023-11-02 NOTE — Progress Notes (Signed)
 BEHAVIORAL MEDICINE, [facility] [place]  [address]  [place] [address]  Operated by [facility]  Progress Note    Name: Peggy Pugh MRN:  [id number]   Date: 11/02/2023 DOB:  Apr 27, 1956 (67 y.o.)           Chief Complaint: Panic Disorder and Insomnia    Subjective:   Patient reports that Wednesday and Thursday, she started taking the Trazodone  100 mg and she slept. Friday, she didn't sleep at all. Saturday ,she doesn't even remember laying down. Sunday she slept. Monday she slept. She was waking up at 4am and she is not an early riser. This week has been better than what she can remember. She feels overall, that the Trazodone  She has noticed a sand-paper rash over the injection site.   Mood: "good".   Medications: Working fairly well over-all.  Appetite: Doing normal.  Sleep: Doing better.   Energy: Getting a burst of energy late in the day once in awhile.   Stressors: None.    Patient Active Problem List   Diagnosis    GAD (generalized anxiety disorder)    Panic attacks    Major depressive disorder    Psychophysiological insomnia     Past Medical History:   Diagnosis Date    Acid reflux     Generalized anxiety disorder     Irregular heart rate     Panic attacks 10/19/2021     Past Surgical History:   Procedure Laterality Date    HX CARPAL TUNNEL RELEASE      HX ROTATOR CUFF REPAIR       Family History   Problem Relation Name Age of Onset    Hypertension (High Blood Pressure) Mother      Anxiety Mother      Diabetes Brother       Social History     Socioeconomic History    Marital status: Married   Tobacco Use    Smoking status: Former     Types: Cigarettes     Passive exposure: Never    Smokeless tobacco: Never   Vaping Use    Vaping status: Never Used   Substance and Sexual Activity    Alcohol use: Never    Drug use: Never     Social Determinants of Health     Financial Resource Strain: Low Risk  (10/18/2022)    Received from [facility], [facility]     Overall Financial Resource Strain (CARDIA)     Difficulty of Paying Living Expenses: Not very hard   Transportation Needs: No Transportation Needs (10/18/2022)    Received from [facility], [facility]    PRAPARE - Transportation     Lack of Transportation (Medical): No     Lack of Transportation (Non-Medical): No   Social Connections: Socially Integrated (10/18/2022)    Received from [facility], [facility]    Social Network     How would you rate your social network (family, work, friends)?: Good participation with social networks   Intimate Partner Violence: Not At Risk (10/18/2022)    Received from [facility], [facility]    HITS     Over the last 12 months how often did your partner physically hurt you?: Never     Over the last 12 months how often did your partner insult you or talk down to you?: Never     Over the last 12 months how often did your partner threaten  you with physical harm?: Never     Over the last 12 months how often did your partner scream or curse at you?: Never   Housing Stability: Low Risk  (10/18/2022)    Received from [facility], [facility]    Housing Stability Vital Sign     Unable to Pay for Housing in the Last Year: No     Number of Places Lived in the Last Year: 1     Unstable Housing in the Last Year: No      Mirtazapine , 5-hydroxytryptophan, Atorvastatin, Clindamycin, Robaxin  [methocarbamol ], Bactrim [sulfamethoxazole-trimethoprim], Nsaids (non-steroidal anti-inflammatory drug), and Tramadol   Current Outpatient Medications   Medication Sig    ALPRAZolam  (XANAX ) 1 mg Oral Tablet Take 1 Tablet (1 mg total) by mouth Every 8 hours as needed for Anxiety for up to 120 days    atenoloL (TENORMIN) 50 mg Oral Tablet Take 1 Tablet (50 mg total) by mouth Daily    cranberry fruit (CRANBERRY) 450 mg Oral Tablet Take 2 Tablets (900 mg total) by mouth Three times daily with meals    fluticasone propionate (FLONASE) 50 mcg/actuation Nasal Spray, Suspension Administer 2 Sprays  into each nostril Once per day as needed for Other (Nasal congestion)    imipramine  (TOFRANIL ) 50 mg Oral Tablet Take 2 Tablets (100 mg total) by mouth Every night    Lactobac 40-Bifido 3-S.thermop 100 billion cell Oral Capsule Take 1 Capsule by mouth Once a day    magnesium oxide (MAG-OX) 400 mg Oral Tablet Take 1 Tablet (400 mg total) by mouth Every night    pantoprazole (PROTONIX) 40 mg Oral Tablet, Delayed Release (E.C.) Take 1 Tablet (40 mg total) by mouth Daily      Objective :  BP 120/80   Pulse 68   Resp 16   Ht 1.524 m (5')   Wt 58.1 kg (128 lb)   BMI 25.00 kg/m       PHQ Total Score  PHQ 2 Total: 0  PHQ 9 Total: 2  Interpretation of Total Score: 0-4 No depression             10/17/2023     9:29 AM 10/26/2023    10:02 AM 11/02/2023    11:19 AM   Most Recent PHQ-9 Scores   PHQ 9 Total 6 3 2         Mental Status Exam  AXOX4. Casual dress, calm, well-groomed. No SI, HI, AVH, delusions, or paranoia. Thoughts are logical, coherent, and goal-directed. Good eye contact. Speech is normal in rate and tone. Mood is "good" affect congruent. No psychomotor agitation or retardation, cogwheel rigidity, or abnormal movements. Gait is normal. Attention, concentration, and memory are good. No cognitive deficits noted. Judgment and insight are fair. Calculation and abstraction are within normal limits.     Data Reviewed  I have reviewed patient's previous note medical, surgical, family, and social history in detail today,     Assessment  Panic attacks , MDD, GAD, and Insomnia.    Plan  Trazodone  100 mg qhs for mood/sleep.  Continue Xanax  1 mg tid/prn for anxiety/panic attacks.     Follow up  Return to clinic in one week.    Alline Ivans, PA-C

## 2023-11-07 ENCOUNTER — Ambulatory Visit (HOSPITAL_PSYCHIATRIC): Payer: Self-pay | Admitting: PHYSICIAN ASSISTANT

## 2023-11-08 ENCOUNTER — Other Ambulatory Visit: Payer: Self-pay

## 2023-11-08 ENCOUNTER — Encounter (HOSPITAL_PSYCHIATRIC): Payer: Self-pay | Admitting: PHYSICIAN ASSISTANT

## 2023-11-08 ENCOUNTER — Ambulatory Visit: Payer: Self-pay | Attending: PHYSICIAN ASSISTANT | Admitting: PHYSICIAN ASSISTANT

## 2023-11-08 VITALS — BP 122/73 | HR 76 | Ht 60.0 in | Wt 128.0 lb

## 2023-11-08 DIAGNOSIS — F329 Major depressive disorder, single episode, unspecified: Secondary | ICD-10-CM | POA: Insufficient documentation

## 2023-11-08 DIAGNOSIS — F410 Panic disorder [episodic paroxysmal anxiety] without agoraphobia: Secondary | ICD-10-CM | POA: Insufficient documentation

## 2023-11-08 DIAGNOSIS — Z79899 Other long term (current) drug therapy: Secondary | ICD-10-CM

## 2023-11-08 DIAGNOSIS — F411 Generalized anxiety disorder: Secondary | ICD-10-CM | POA: Insufficient documentation

## 2023-11-08 DIAGNOSIS — F5104 Psychophysiologic insomnia: Secondary | ICD-10-CM | POA: Insufficient documentation

## 2023-11-08 DIAGNOSIS — G4700 Insomnia, unspecified: Secondary | ICD-10-CM

## 2023-11-08 MED ORDER — TRAZODONE 100 MG TABLET
100.0000 mg | ORAL_TABLET | Freq: Every evening | ORAL | 1 refills | Status: DC
Start: 2023-11-08 — End: 2023-11-29

## 2023-11-08 MED ORDER — ALPRAZOLAM 1 MG TABLET
1.0000 mg | ORAL_TABLET | Freq: Three times a day (TID) | ORAL | 4 refills | Status: AC | PRN
Start: 2023-11-08 — End: 2024-04-06

## 2023-11-08 NOTE — Patient Instructions (Signed)
 Take medications as prescribed, avoid drugs and alcohol, call office if symptoms worsen or problems arise.  [phone].

## 2023-11-08 NOTE — Progress Notes (Signed)
 BEHAVIORAL MEDICINE, [facility] [place]  [address]  [place] [address]  Operated by [facility]  Progress Note    Name: Peggy Pugh MRN:  [id number]   Date: 11/08/2023 DOB:  March 20, 1956 (68 y.o.)           Chief Complaint: Panic Disorder, Panic Attacks, Generalized Anxiety, and Major Depression    Subjective:   Patient reports that she had a great weekend. She has been doing really well. She doesn't do naps in the summer, because of the warm weather and wanting to be outdoors. She took a late nap from five until five-thirty and went to lay back down again until ten minutes to six. The plans for the beach are waiting for parts to come in for her son's car. They are hoping they will come in today and they could leave this week. They stay through the fall, but is taking it one day at a tim   Mood: "pretty good".   Medications: She takes the Trazodone  about forty minutes before going to bed.   Appetite Eating normally.  Sleep: Did well except for last night, when she didn't sleep at all.   Energy: Doing fairly well.   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    Patient Active Problem List   Diagnosis    GAD (generalized anxiety disorder)    Panic attacks    Major depressive disorder    Psychophysiological insomnia     Past Medical History:   Diagnosis Date    Acid reflux     Generalized anxiety disorder     Irregular heart rate     Panic attacks 10/19/2021     Past Surgical History:   Procedure Laterality Date    HX CARPAL TUNNEL RELEASE      HX ROTATOR CUFF REPAIR       Family History   Problem Relation Name Age of Onset    Hypertension (High Blood Pressure) Mother      Anxiety Mother      Diabetes Brother       Social History     Socioeconomic History    Marital status: Married   Tobacco Use    Smoking status: Former     Types: Cigarettes     Passive exposure: Never    Smokeless tobacco: Never   Vaping Use    Vaping status: Never Used   Substance and  Sexual Activity    Alcohol use: Never    Drug use: Never     Social Determinants of Health     Financial Resource Strain: Low Risk  (10/18/2022)    Received from [facility], [facility]    Overall Financial Resource Strain (CARDIA)     Difficulty of Paying Living Expenses: Not very hard   Transportation Needs: No Transportation Needs (10/18/2022)    Received from [facility], [facility]    PRAPARE - Transportation     Lack of Transportation (Medical): No     Lack of Transportation (Non-Medical): No   Social Connections: Socially Integrated (10/18/2022)    Received from [facility], [facility]    Social Network     How would you rate your social network (family, work, friends)?: Good participation with social networks   Intimate Partner Violence: Not At Risk (10/18/2022)    Received from [facility], [facility]    HITS     Over the last 12 months how often did your partner  physically hurt you?: Never     Over the last 12 months how often did your partner insult you or talk down to you?: Never     Over the last 12 months how often did your partner threaten you with physical harm?: Never     Over the last 12 months how often did your partner scream or curse at you?: Never   Housing Stability: Low Risk  (10/18/2022)    Received from [facility], [facility]    Housing Stability Vital Sign     Unable to Pay for Housing in the Last Year: No     Number of Places Lived in the Last Year: 1     Unstable Housing in the Last Year: No      Mirtazapine , 5-hydroxytryptophan, Atorvastatin, Clindamycin, Robaxin  [methocarbamol ], Bactrim [sulfamethoxazole-trimethoprim], Nsaids (non-steroidal anti-inflammatory drug), and Tramadol   Current Outpatient Medications   Medication Sig    ALPRAZolam  (XANAX ) 1 mg Oral Tablet Take 1 Tablet (1 mg total) by mouth Every 8 hours as needed for Anxiety for up to 120 days    atenoloL (TENORMIN) 50 mg Oral Tablet Take 1 Tablet (50 mg total) by mouth Daily    cranberry fruit  (CRANBERRY) 450 mg Oral Tablet Take 2 Tablets (900 mg total) by mouth Three times daily with meals    fluticasone propionate (FLONASE) 50 mcg/actuation Nasal Spray, Suspension Administer 2 Sprays into each nostril Once per day as needed for Other (Nasal congestion)    Lactobac 40-Bifido 3-S.thermop 100 billion cell Oral Capsule Take 1 Capsule by mouth Once a day    magnesium oxide (MAG-OX) 400 mg Oral Tablet Take 1 Tablet (400 mg total) by mouth Every night    pantoprazole (PROTONIX) 40 mg Oral Tablet, Delayed Release (E.C.) Take 1 Tablet (40 mg total) by mouth Daily    traZODone  (DESYREL ) 100 mg Oral Tablet Take 1 Tablet (100 mg total) by mouth Every night      Objective :  BP 122/73 (Site: Left Arm, Patient Position: Sitting)   Pulse 76   Ht 1.524 m (5')   Wt 58.1 kg (128 lb)   BMI 25.00 kg/m       PHQ Total Score  PHQ 2 Total: 0  PHQ 9 Total: 0  Interpretation of Total Score: 0-4 No depression             10/26/2023    10:02 AM 11/02/2023    11:19 AM 11/08/2023     8:40 AM   Most Recent PHQ-9 Scores   PHQ 9 Total 3 2 0        Mental Status Exam  AXOX4. Casual dress, calm, well-groomed. No SI, HI, AVH, delusions, or paranoia. Thoughts are logical, coherent, and goal-directed. Good eye contact. Speech is normal in rate and tone. Mood is "pretty good" affect congruent. No psychomotor agitation or retardation, cogwheel rigidity, or abnormal movements. Gait is normal. Attention, concentration, and memory are good. No cognitive deficits noted. Judgment and insight are fair. Calculation and abstraction are within normal limits.     Data Reviewed  I have reviewed patient's previous note medical, surgical, family, and social history in detail today,     Assessment  Panic Disorder, Panic Attacks, Generalized Anxiety, and Major Depression    Plan  Trazodone  100 mg qhs for mood/sleep.  Continue Xanax  1 mg tid/prn for anxiety/panic attacks.       Follow up  Return to clinic in 3 months.    Alline Ivans, PA-C

## 2023-11-09 ENCOUNTER — Encounter (HOSPITAL_PSYCHIATRIC): Payer: Self-pay | Admitting: PHYSICIAN ASSISTANT

## 2023-11-09 NOTE — Telephone Encounter (Signed)
 Patient called and said she had another sleepless night. She didn't take any naps or anything. Slept for maybe 3 hours total. Happy to call patient back with any advice/orders you may have. Thanks!

## 2023-11-10 ENCOUNTER — Encounter (HOSPITAL_PSYCHIATRIC): Payer: Self-pay | Admitting: PHYSICIAN ASSISTANT

## 2023-11-10 NOTE — Telephone Encounter (Signed)
 Patient called and said she has been nauseous since Tuesday and it  hasn't subsided and she's  been taking Zofran .Do you think it could be from the Trazodone ? And she wanted to let you know she did sleep last night.

## 2023-11-11 ENCOUNTER — Encounter (HOSPITAL_PSYCHIATRIC): Payer: Self-pay | Admitting: PHYSICIAN ASSISTANT

## 2023-11-11 NOTE — Telephone Encounter (Signed)
 Patient called said she slept but woke up at 2 am still nauseous and still feel like that this morning. I advised her to seek care from her PCP yesterday. Happy to call patient back with any advice/ordered you have or patients phone number is     [phone]    Thanks!

## 2023-11-22 ENCOUNTER — Encounter (INDEPENDENT_AMBULATORY_CARE_PROVIDER_SITE_OTHER): Payer: Self-pay

## 2023-11-29 ENCOUNTER — Other Ambulatory Visit (HOSPITAL_PSYCHIATRIC): Payer: Self-pay | Admitting: PHYSICIAN ASSISTANT

## 2023-11-29 MED ORDER — TRAZODONE 100 MG TABLET
100.0000 mg | ORAL_TABLET | Freq: Every evening | ORAL | 1 refills | Status: AC
Start: 2023-11-29 — End: ?

## 2023-11-29 NOTE — Telephone Encounter (Signed)
 Got a request for this from [facility] but note said patient is out of town. I called patient and she needs it sent to the [facility] in [place] . I built the prescription if you don't mind to approve or deny. Thank You

## 2023-12-06 ENCOUNTER — Other Ambulatory Visit (HOSPITAL_PSYCHIATRIC): Payer: Self-pay | Admitting: Family

## 2023-12-06 NOTE — Telephone Encounter (Signed)
 Refills on file at pharmacy

## 2024-02-23 ENCOUNTER — Ambulatory Visit (HOSPITAL_PSYCHIATRIC): Payer: Self-pay | Admitting: PHYSICIAN ASSISTANT
